# Patient Record
Sex: Male | Born: 1965
Health system: Southern US, Community
[De-identification: ages and names within clinical notes are randomized; demographics above are authoritative.]

## PROBLEM LIST (undated history)

## (undated) DIAGNOSIS — F39 Unspecified mood [affective] disorder: Secondary | ICD-10-CM

## (undated) DIAGNOSIS — F319 Bipolar disorder, unspecified: Secondary | ICD-10-CM

## (undated) DIAGNOSIS — F23 Brief psychotic disorder: Secondary | ICD-10-CM

## (undated) HISTORY — PX: FRACTURE SURGERY: SHX138

---

## 1999-05-21 ENCOUNTER — Emergency Department (HOSPITAL_COMMUNITY): Admission: EM | Admit: 1999-05-21 | Discharge: 1999-05-21 | Payer: Self-pay | Admitting: Emergency Medicine

## 1999-07-08 ENCOUNTER — Encounter: Payer: Self-pay | Admitting: Emergency Medicine

## 1999-07-08 ENCOUNTER — Emergency Department (HOSPITAL_COMMUNITY): Admission: EM | Admit: 1999-07-08 | Discharge: 1999-07-08 | Payer: Self-pay | Admitting: Emergency Medicine

## 1999-07-27 ENCOUNTER — Emergency Department (HOSPITAL_COMMUNITY): Admission: EM | Admit: 1999-07-27 | Discharge: 1999-07-27 | Payer: Self-pay | Admitting: Emergency Medicine

## 1999-09-10 ENCOUNTER — Encounter: Payer: Self-pay | Admitting: Emergency Medicine

## 1999-09-10 ENCOUNTER — Emergency Department (HOSPITAL_COMMUNITY): Admission: EM | Admit: 1999-09-10 | Discharge: 1999-09-10 | Payer: Self-pay | Admitting: Emergency Medicine

## 1999-09-11 ENCOUNTER — Encounter: Payer: Self-pay | Admitting: Emergency Medicine

## 1999-09-11 ENCOUNTER — Emergency Department (HOSPITAL_COMMUNITY): Admission: EM | Admit: 1999-09-11 | Discharge: 1999-09-11 | Payer: Self-pay | Admitting: Emergency Medicine

## 1999-09-23 ENCOUNTER — Emergency Department (HOSPITAL_COMMUNITY): Admission: EM | Admit: 1999-09-23 | Discharge: 1999-09-24 | Payer: Self-pay | Admitting: Emergency Medicine

## 2002-04-20 ENCOUNTER — Emergency Department (HOSPITAL_COMMUNITY): Admission: EM | Admit: 2002-04-20 | Discharge: 2002-04-20 | Payer: Self-pay | Admitting: Emergency Medicine

## 2002-09-30 ENCOUNTER — Emergency Department (HOSPITAL_COMMUNITY): Admission: EM | Admit: 2002-09-30 | Discharge: 2002-09-30 | Payer: Self-pay | Admitting: Emergency Medicine

## 2003-09-25 ENCOUNTER — Emergency Department (HOSPITAL_COMMUNITY): Admission: EM | Admit: 2003-09-25 | Discharge: 2003-09-25 | Payer: Self-pay | Admitting: *Deleted

## 2004-01-10 ENCOUNTER — Emergency Department (HOSPITAL_COMMUNITY): Admission: EM | Admit: 2004-01-10 | Discharge: 2004-01-10 | Payer: Self-pay | Admitting: Emergency Medicine

## 2004-01-22 ENCOUNTER — Ambulatory Visit (HOSPITAL_COMMUNITY): Admission: RE | Admit: 2004-01-22 | Discharge: 2004-01-22 | Payer: Self-pay | Admitting: Orthopedic Surgery

## 2004-02-05 ENCOUNTER — Ambulatory Visit: Payer: Self-pay | Admitting: Family Medicine

## 2004-04-09 ENCOUNTER — Ambulatory Visit: Payer: Self-pay | Admitting: Family Medicine

## 2004-10-31 ENCOUNTER — Emergency Department (HOSPITAL_COMMUNITY): Admission: EM | Admit: 2004-10-31 | Discharge: 2004-10-31 | Payer: Self-pay | Admitting: Emergency Medicine

## 2004-11-11 ENCOUNTER — Emergency Department (HOSPITAL_COMMUNITY): Admission: EM | Admit: 2004-11-11 | Discharge: 2004-11-11 | Payer: Self-pay | Admitting: Emergency Medicine

## 2004-11-17 ENCOUNTER — Emergency Department (HOSPITAL_COMMUNITY): Admission: EM | Admit: 2004-11-17 | Discharge: 2004-11-17 | Payer: Self-pay | Admitting: Emergency Medicine

## 2005-04-05 ENCOUNTER — Ambulatory Visit: Payer: Self-pay | Admitting: Psychiatry

## 2005-04-05 ENCOUNTER — Inpatient Hospital Stay (HOSPITAL_COMMUNITY): Admission: EM | Admit: 2005-04-05 | Discharge: 2005-04-10 | Payer: Self-pay | Admitting: Psychiatry

## 2017-06-23 ENCOUNTER — Other Ambulatory Visit: Payer: Self-pay

## 2017-06-23 ENCOUNTER — Emergency Department (HOSPITAL_COMMUNITY)
Admission: EM | Admit: 2017-06-23 | Discharge: 2017-06-25 | Disposition: A | Discharge status: Home / Self Care | Payer: Medicaid Other | Attending: Emergency Medicine | Admitting: Emergency Medicine

## 2017-06-23 ENCOUNTER — Encounter (HOSPITAL_COMMUNITY): Payer: Self-pay

## 2017-06-23 DIAGNOSIS — R45851 Suicidal ideations: Secondary | ICD-10-CM

## 2017-06-23 DIAGNOSIS — R443 Hallucinations, unspecified: Secondary | ICD-10-CM | POA: Diagnosis present

## 2017-06-23 DIAGNOSIS — R441 Visual hallucinations: Secondary | ICD-10-CM | POA: Diagnosis not present

## 2017-06-23 DIAGNOSIS — F17210 Nicotine dependence, cigarettes, uncomplicated: Secondary | ICD-10-CM | POA: Insufficient documentation

## 2017-06-23 DIAGNOSIS — F333 Major depressive disorder, recurrent, severe with psychotic symptoms: Secondary | ICD-10-CM | POA: Insufficient documentation

## 2017-06-23 DIAGNOSIS — R440 Auditory hallucinations: Secondary | ICD-10-CM

## 2017-06-23 LAB — CBC
HCT: 49 % (ref 39.0–52.0)
Hemoglobin: 16.8 g/dL (ref 13.0–17.0)
MCH: 29.5 pg (ref 26.0–34.0)
MCHC: 34.3 g/dL (ref 30.0–36.0)
MCV: 86 fL (ref 78.0–100.0)
PLATELETS: 187 10*3/uL (ref 150–400)
RBC: 5.7 MIL/uL (ref 4.22–5.81)
RDW: 14.5 % (ref 11.5–15.5)
WBC: 10 10*3/uL (ref 4.0–10.5)

## 2017-06-23 LAB — COMPREHENSIVE METABOLIC PANEL
ALT: 34 U/L (ref 17–63)
ANION GAP: 12 (ref 5–15)
AST: 41 U/L (ref 15–41)
Albumin: 4.1 g/dL (ref 3.5–5.0)
Alkaline Phosphatase: 85 U/L (ref 38–126)
BUN: 8 mg/dL (ref 6–20)
CO2: 22 mmol/L (ref 22–32)
Calcium: 8.9 mg/dL (ref 8.9–10.3)
Chloride: 101 mmol/L (ref 101–111)
Creatinine, Ser: 0.97 mg/dL (ref 0.61–1.24)
Glucose, Bld: 116 mg/dL — ABNORMAL HIGH (ref 65–99)
POTASSIUM: 3.6 mmol/L (ref 3.5–5.1)
Sodium: 135 mmol/L (ref 135–145)
Total Bilirubin: 0.5 mg/dL (ref 0.3–1.2)
Total Protein: 6.9 g/dL (ref 6.5–8.1)

## 2017-06-23 LAB — RAPID URINE DRUG SCREEN, HOSP PERFORMED
AMPHETAMINES: NOT DETECTED
BENZODIAZEPINES: NOT DETECTED
Barbiturates: NOT DETECTED
Cocaine: NOT DETECTED
OPIATES: NOT DETECTED
Tetrahydrocannabinol: NOT DETECTED

## 2017-06-23 LAB — ETHANOL

## 2017-06-23 LAB — ACETAMINOPHEN LEVEL

## 2017-06-23 LAB — SALICYLATE LEVEL

## 2017-06-23 MED ORDER — LORAZEPAM 1 MG PO TABS
1.0000 mg | ORAL_TABLET | Freq: Once | ORAL | Status: AC
Start: 2017-06-23 — End: 2017-06-23
  Administered 2017-06-23: 1 mg via ORAL
  Filled 2017-06-23: qty 1

## 2017-06-23 MED ORDER — IBUPROFEN 400 MG PO TABS
600.0000 mg | ORAL_TABLET | Freq: Three times a day (TID) | ORAL | Status: DC | PRN
  Administered 2017-06-23 – 2017-06-24 (×2): 600 mg via ORAL
  Filled 2017-06-23 (×4): qty 1

## 2017-06-23 MED ORDER — ALUM & MAG HYDROXIDE-SIMETH 200-200-20 MG/5ML PO SUSP: 30.0000 mL | Freq: Four times a day (QID) | ORAL | Status: DC | PRN

## 2017-06-23 MED ORDER — ZOLPIDEM TARTRATE 5 MG PO TABS
5.0000 mg | ORAL_TABLET | Freq: Every evening | ORAL | Status: DC | PRN
  Administered 2017-06-23 – 2017-06-24 (×2): 5 mg via ORAL
  Filled 2017-06-23 (×2): qty 1

## 2017-06-23 MED ORDER — NICOTINE 21 MG/24HR TD PT24
21.0000 mg | MEDICATED_PATCH | Freq: Every day | TRANSDERMAL | Status: DC
  Administered 2017-06-23 – 2017-06-24 (×2): 21 mg via TRANSDERMAL
  Filled 2017-06-23 (×3): qty 1

## 2017-06-23 MED ORDER — ONDANSETRON HCL 4 MG PO TABS: 4.0000 mg | ORAL_TABLET | Freq: Three times a day (TID) | ORAL | Status: DC | PRN

## 2017-06-23 NOTE — ED Notes (Signed)
PA informed that pt felt anxious, new orders given

## 2017-06-23 NOTE — ED Notes (Signed)
Pt given Malawiturkey sandwich, graham crackers, apple sauce and sprite for a snack

## 2017-06-23 NOTE — ED Provider Notes (Signed)
[facility] Provider Note   CSN: [id number] Arrival date & time: 06/23/17  1318     History   Chief Complaint Chief Complaint  Patient presents with  . Depression  . Hallucinations    HPI Francisco Anderson is a 52 y.o. male who presents the emergency department for hallucinations and suicidal thoughts.  His past medical history of schizophrenia and has states that he has been off of his medication for about 1 year.  The patient states that he feels like someone is out to try and kill him.  He is unable to specify who it is.  He is hearing voices and having visual hallucinations.  The voices are telling him to kill himself.  He states that "I just can't calm my mind down."  He admits to drinking about two 40 ounces of beer daily but denies any shakes when he does not drink and denies a history of DTs, seizures.  He does smoke daily.  He uses no other drugs.  He denies homicidal ideation. HPI  History reviewed. No pertinent past medical history.  There are no active problems to display for this patient.   History reviewed. No pertinent surgical history.      Home Medications    Prior to Admission medications   Not on File    Family History No family history on file.  Social History Social History   Tobacco Use  . Smoking status: Not on file  Substance Use Topics  . Alcohol use: Not on file  . Drug use: Not on file     Allergies   Patient has no known allergies.   Review of Systems Review of Systems  Ten systems reviewed and are negative for acute change, except as noted in the HPI.   Physical Exam Updated Vital Signs BP (!) 171/101 (BP Location: Right Arm)   Pulse (!) 101   Temp 98.8 F (37.1 C) (Oral)   Resp 17   Ht 5\' 7"  (1.702 m)   Wt 90.7 kg (200 lb)   SpO2 95%   BMI 31.32 kg/m   Physical Exam  Constitutional: He appears well-developed and well-nourished. No distress.  Smells strongly of cigarettes    HENT:  Head: Normocephalic and atraumatic.  Eyes: Conjunctivae are normal. No scleral icterus.  Neck: Normal range of motion. Neck supple.  Cardiovascular: Normal rate, regular rhythm and normal heart sounds.  Pulmonary/Chest: Effort normal and breath sounds normal. No respiratory distress.  Abdominal: Soft. There is no tenderness.  Musculoskeletal: He exhibits no edema.  Neurological: He is alert.  Skin: Skin is warm and dry. He is not diaphoretic.  Psychiatric: His behavior is normal. His mood appears anxious. Thought content is paranoid. He expresses suicidal ideation.  Nursing note and vitals reviewed.    ED Treatments / Results  Labs (all labs ordered are listed, but only abnormal results are displayed) Labs Reviewed  COMPREHENSIVE METABOLIC PANEL - Abnormal; Notable for the following components:      Result Value   Glucose, Bld 116 (*)    All other components within normal limits  ACETAMINOPHEN LEVEL - Abnormal; Notable for the following components:   Acetaminophen (Tylenol), Serum <10 (*)    All other components within normal limits  ETHANOL  SALICYLATE LEVEL  CBC  RAPID URINE DRUG SCREEN, HOSP PERFORMED    EKG None  Radiology No results found.  Procedures Procedures (including critical care time)  Medications Ordered in ED Medications  ibuprofen (  ADVIL,MOTRIN) tablet 600 mg (has no administration in time range)  zolpidem (AMBIEN) tablet 5 mg (has no administration in time range)  ondansetron (ZOFRAN) tablet 4 mg (has no administration in time range)  alum & mag hydroxide-simeth (MAALOX/MYLANTA) 200-200-20 MG/5ML suspension 30 mL (has no administration in time range)  nicotine (NICODERM CQ - dosed in mg/24 hours) patch 21 mg (has no administration in time range)  LORazepam (ATIVAN) tablet 1 mg (has no administration in time range)     Initial Impression / Assessment and Plan / ED Course  I have reviewed the triage vital signs and the nursing  notes.  Pertinent labs & imaging results that were available during my care of the patient were reviewed by me and considered in my medical decision making (see chart for details).     Labs resulted. Appears appropriate for psych eval  Final Clinical Impressions(s) / ED Diagnoses   Final diagnoses:  Suicidal ideation  Auditory hallucination  Visual hallucinations    ED Discharge Orders    None       Arthor Captain, PA-C 06/23/17 1517    Mesner, Barbara Cower, MD 06/23/17 1708

## 2017-06-23 NOTE — ED Triage Notes (Signed)
Pt presents for evaluation of SI and auditory hallucinations x 1 month. Pt reports hx of same. Pt tearful in triage, reports voices telling him to harm himself and others will harm him. Pt reports he drinks approximately 2 40 oz beers a day. Pt reports he feels he "cant rest his mind."

## 2017-06-23 NOTE — ED Notes (Signed)
TTS set up for pt 

## 2017-06-23 NOTE — BH Assessment (Addendum)
Tele Assessment Note   Patient Name: Francisco Anderson MRN: [id number] Referring Physician: Dr. Clayborne Dana Location of Patient: [facility] Location of Provider: Behavioral Health TTS Department  Francisco Anderson is a 52 y.o. male who presents to the ED due to auditory and visual hallucinations. Pt presents with labile affect, first very tearful and then irritable towards the end of the assessment. Pt reports that he's been hearing voices telling him to kill himself before someone kills him. He also said that the voices have told him to harm or kill others, but when asked to elaborate, pt said "I just don't wanna get hurt". Pt also indicates that he has started seeing "visions" of "everything". When asked for details of the visions, pt just continues to say "everything". Pt reports having the hallucinations since the beginning of the month. Pt reports not sleeping in 6 or 7 days due to the hallucinations. Pt additionally reports that he used some crack/cocaine at the beginning of the month, but none since then. Pt doesn't believe there is a link to the onset of his hallucinations and his relapse of drugs.  Pt reports that he had been taking medication for his hallucinations until last September, when he felt that he was stable enough to d/c them.     Diagnosis: MDD, recurrent episode, severe, w/ psychotic features  Past Medical History: History reviewed. No pertinent past medical history.  History reviewed. No pertinent surgical history.  Family History: No family history on file.  Social History:  has no tobacco, alcohol, and drug history on file.  Additional Social History:  Alcohol / Drug Use Pain Medications: none Prescriptions: none Over the Counter: none History of alcohol / drug use?: Yes(Pt not forthcoming with information about his drug use. ) Substance #1 Name of Substance 1: crack/cocaine 1 - Last Use / Amount: beginning of the month Substance #2 Name of Substance 2: alcohol (beer) 2 -  Amount (size/oz): 2 40oz beers 2 - Frequency: daily 2 - Duration: ongoing 2 - Last Use / Amount: yesterday  CIWA: CIWA-Ar BP: (!) 171/101 Pulse Rate: (!) 101 COWS:    Allergies: No Known Allergies  Home Medications:  (Not in a hospital admission)  OB/GYN Status:  No LMP for male patient.  General Assessment Data Location of Assessment: [facility] ED TTS Assessment: In system Is this a Tele or Face-to-Face Assessment?: Tele Assessment Is this an Initial Assessment or a Re-assessment for this encounter?: Initial Assessment Marital status: Single Living Arrangements: Other relatives Can pt return to current living arrangement?: Yes Admission Status: Voluntary Is patient capable of signing voluntary admission?: Yes Referral Source: Self/Family/Friend     Crisis Care Plan Living Arrangements: Other relatives Name of Psychiatrist: none Name of Therapist: none  Education Status Is patient currently in school?: No Is the patient employed, unemployed or receiving disability?: Receiving disability income(SSI)  Risk to self with the past 6 months Suicidal Ideation: Yes-Currently Present Has patient been a risk to self within the past 6 months prior to admission? : No Suicidal Intent: No Has patient had any suicidal intent within the past 6 months prior to admission? : No Is patient at risk for suicide?: Yes Suicidal Plan?: No Has patient had any suicidal plan within the past 6 months prior to admission? : No Access to Means: No Previous Attempts/Gestures: Yes How many times?: 3 Triggers for Past Attempts: Hallucinations Intentional Self Injurious Behavior: None Family Suicide History: Unknown Recent stressful life event(s): Other (Comment) Persecutory voices/beliefs?: No Depression: Yes  Depression Symptoms: Feeling angry/irritable, Feeling worthless/self pity, Loss of interest in usual pleasures, Fatigue, Isolating, Tearfulness, Insomnia, Despondent Substance abuse history  and/or treatment for substance abuse?: Yes Suicide prevention information given to non-admitted patients: Not applicable  Risk to Others within the past 6 months Homicidal Ideation: No Does patient have any lifetime risk of violence toward others beyond the six months prior to admission? : Unknown Thoughts of Harm to Others: Yes-Currently Present Comment - Thoughts of Harm to Others: Pt reports voices are telling him to harm others. Current Homicidal Intent: No Current Homicidal Plan: No Access to Homicidal Means: No History of harm to others?: No Assessment of Violence: None Noted Does patient have access to weapons?: No Criminal Charges Pending?: No Does patient have a court date: No Is patient on probation?: Unknown  Psychosis Hallucinations: Auditory, Visual Delusions: None noted  Mental Status Report Appearance/Hygiene: Unremarkable Eye Contact: Poor Motor Activity: Unremarkable Speech: Logical/coherent Level of Consciousness: Quiet/awake Mood: Irritable, Depressed Affect: Appropriate to circumstance Anxiety Level: Minimal Thought Processes: Coherent, Relevant Judgement: Impaired Orientation: Person, Place, Time, Situation Obsessive Compulsive Thoughts/Behaviors: None  Cognitive Functioning Concentration: Unable to Assess Memory: Unable to Assess Is patient IDD: No Is patient DD?: No Insight: Fair Impulse Control: Unable to Assess Appetite: Fair Have you had any weight changes? : No Change Sleep: Decreased Total Hours of Sleep: 0 Vegetative Symptoms: None  ADLScreening [facility] Assessment Services) Patient's cognitive ability adequate to safely complete daily activities?: Yes Patient able to express need for assistance with ADLs?: Yes Independently performs ADLs?: Yes (appropriate for developmental age)  Prior Inpatient Therapy Prior Inpatient Therapy: Yes Prior Therapy Dates: unknown Prior Therapy Facilty/Provider(s): unknown Reason for Treatment:  hallucinations  Prior Outpatient Therapy Prior Outpatient Therapy: Yes Prior Therapy Dates: unknown Prior Therapy Facilty/Provider(s): unknown Reason for Treatment: hallucinations Does patient have an ACCT team?: No Does patient have Intensive In-House Services?  : No Does patient have [facility] services? : No Does patient have P4CC services?: No  ADL Screening (condition at time of admission) Patient's cognitive ability adequate to safely complete daily activities?: Yes Does the patient have difficulty seeing, even when wearing glasses/contacts?: No Does the patient have difficulty concentrating, remembering, or making decisions?: Yes Patient able to express need for assistance with ADLs?: Yes Does the patient have difficulty dressing or bathing?: No Independently performs ADLs?: Yes (appropriate for developmental age) Does the patient have difficulty walking or climbing stairs?: No Weakness of Legs: None Weakness of Arms/Hands: None  Home Assistive Devices/Equipment Home Assistive Devices/Equipment: None    Abuse/Neglect Assessment (Assessment to be complete while patient is alone) Abuse/Neglect Assessment Can Be Completed: Yes Physical Abuse: Denies Verbal Abuse: Denies Sexual Abuse: Denies Exploitation of patient/patient's resources: Denies Self-Neglect: Denies Values / Beliefs Cultural Requests During Hospitalization: None Spiritual Requests During Hospitalization: None   Advance Directives (For Healthcare) Does Patient Have a Medical Advance Directive?: No Would patient like information on creating a medical advance directive?: No - Patient declined Nutrition Screen- [facility] Adult/WL/AP Patient's home diet: Regular Has the patient recently lost weight without trying?: No Has the patient been eating poorly because of a decreased appetite?: No Malnutrition Screening Tool Score: 0        Disposition: Pt is recommended for IP treatment.  Disposition Initial Assessment  Completed for this Encounter: Yes(consulted with Assunta FoundShuvon Rankin, NP)  This service was provided via telemedicine using a 2-way, interactive audio and Immunologistvideo technology.  Names of all persons participating in this telemedicine service and their role in this  encounter.   Laddie Aquas 06/23/2017 3:10 PM

## 2017-06-23 NOTE — ED Notes (Signed)
Dinner tray provided

## 2017-06-23 NOTE — Progress Notes (Signed)
Pt is appropriate for inpatient treatment per Assunta FoundShuvon Rankin, NP. CSW sent referral information to the following hospitals: [facility]  [facility]  [facility]  [facility]  [facility]  [facility]  CSW will continue to coordinate placement options.   Wells GuilesSarah Caylen Yardley, LCSW, LCAS Disposition CSW [facility]/TTS [phone]

## 2017-06-24 MED ORDER — SERTRALINE HCL 25 MG PO TABS
25.0000 mg | ORAL_TABLET | Freq: Every day | ORAL | Status: DC
  Administered 2017-06-24 – 2017-06-25 (×2): 25 mg via ORAL
  Filled 2017-06-24 (×3): qty 1

## 2017-06-24 MED ORDER — QUETIAPINE FUMARATE 25 MG PO TABS
25.0000 mg | ORAL_TABLET | Freq: Two times a day (BID) | ORAL | Status: DC
  Administered 2017-06-24 – 2017-06-25 (×3): 25 mg via ORAL
  Filled 2017-06-24 (×4): qty 1

## 2017-06-24 MED ORDER — HYDROXYZINE HCL 25 MG PO TABS
25.0000 mg | ORAL_TABLET | Freq: Three times a day (TID) | ORAL | Status: DC | PRN
  Administered 2017-06-24 (×2): 25 mg via ORAL
  Filled 2017-06-24 (×2): qty 1

## 2017-06-24 NOTE — ED Notes (Signed)
Regular Diet ordered for Dinner. 

## 2017-06-24 NOTE — Progress Notes (Signed)
Medication recommendation completed:  Plan: Start Seroquel 25 mg 1 tab po twice daily for psychosis Start vistaril 25 mg 1 tab po three times a day as needed for anxiety. Restart Zoloft 25 mg daily for depression Continue other current medications without change.  Signed: Ferne ReusJustina Sekou Zuckerman, NP-C

## 2017-06-24 NOTE — ED Notes (Signed)
Pt in shower.  

## 2017-06-24 NOTE — ED Notes (Signed)
A Snack and drink was placed at patient bedside. 

## 2017-06-24 NOTE — BH Assessment (Signed)
Pt is cooperative during reassessment. He reports he didn't sleep well until he got his night meds. He reports he ate a little bit of breakfast. Pt denies SI currently. He endorses auditory hallucinations.. He says the voices "sound like they are inside". He reports he has experienced AH since age 52. When asked if any meds have helped with his hallucinations, pt says that he used to get a weekly injection that helped him (doesn't remember name). Writer discussed patient with Leighton Ruffina Okonkwo NP who recommends inpatient treatment. TTS will continue to seek inpatient placement.  Evette Cristalaroline Paige Tylerjames Hoglund, [place] Therapeutic Triage Specialist

## 2017-06-24 NOTE — ED Notes (Signed)
Snack and drink provided. ?

## 2017-06-24 NOTE — ED Notes (Signed)
Re-TTS being performed.  

## 2017-06-24 NOTE — ED Notes (Signed)
Male visitor visiting w/pt. 

## 2017-06-24 NOTE — ED Notes (Signed)
Pt asking for Ativan states d/t "need to rest". States he is not experiencing AH/VH and denies SI/HI. Pt noted to be slow responding to questions.

## 2017-06-24 NOTE — ED Notes (Signed)
Pt requesting ibuprofen, informed him his last dose was 2150 and it is ordered every 8 hrs. Pt understanding and went back to room

## 2017-06-24 NOTE — ED Notes (Signed)
Pt made phone call from nurses' desk.  

## 2017-06-24 NOTE — ED Notes (Signed)
Ordered diet tray 

## 2017-06-24 NOTE — ED Notes (Signed)
Pt requesting ibuprofen again, told him he has to wait until 0550.

## 2017-06-24 NOTE — ED Notes (Signed)
Regular Diet was ordered for Lunch. 

## 2017-06-24 NOTE — ED Notes (Signed)
[facility] aware pt requesting meds to help w/AH - "You're not worth nothing". Orders received.

## 2017-06-25 ENCOUNTER — Other Ambulatory Visit: Payer: Self-pay

## 2017-06-25 DIAGNOSIS — F29 Unspecified psychosis not due to a substance or known physiological condition: Secondary | ICD-10-CM | POA: Diagnosis not present

## 2017-06-25 DIAGNOSIS — F419 Anxiety disorder, unspecified: Secondary | ICD-10-CM

## 2017-06-25 MED ORDER — QUETIAPINE FUMARATE 25 MG PO TABS: 25.0000 mg | ORAL_TABLET | Freq: Two times a day (BID) | ORAL | 0 refills | Status: DC

## 2017-06-25 MED ORDER — SERTRALINE HCL 25 MG PO TABS: 25.0000 mg | ORAL_TABLET | Freq: Every day | ORAL | 0 refills | Status: DC

## 2017-06-25 MED ORDER — HYDROXYZINE HCL 25 MG PO TABS: 25.0000 mg | ORAL_TABLET | Freq: Four times a day (QID) | ORAL | 0 refills | Status: AC | PRN

## 2017-06-25 NOTE — Progress Notes (Signed)
CSW notified [facility] that patient was going to be discharged.   Moss McKy-Sha Roosevelt Bisher MSW, LCSW-A, LCAS-A Clinical Social Worker 06/25/2017 11:12 AM

## 2017-06-25 NOTE — ED Notes (Signed)
Family visiting w/pt.  

## 2017-06-25 NOTE — ED Notes (Signed)
Pt given water as requested.

## 2017-06-25 NOTE — BH Assessment (Signed)
Patient cooperative during reassessment. He currently denies any auditory hallucinations. He says he slept "good" and his appetite is "great". Pt says, "I'm ready to go home." He denies suicidal ideations and denies homicidal ideations. Writer spoke with Leighton Ruffina Okonkwo NP who is in agreement that pt can go home. He is not a danger to himself or others. He [facility]. He says his family just visited him and he lives with the family. Writer notified pt's RN Kriste BasqueBecky who will speak w/ EDP re: disposition recommendation.  Evette Cristalaroline Paige Marshal Eskew, [place]CS[place] Therapeutic Triage Specialist

## 2017-06-25 NOTE — ED Notes (Signed)
Pt aware his family member is in lobby waiting to pick him up.

## 2017-06-25 NOTE — ED Notes (Signed)
Pt called his girlfriend/cousin to advise she will come pick him up but "it will be a little while". Pt returned to exam room and laid down on bed.

## 2017-06-25 NOTE — ED Notes (Addendum)
Visitor, Kelle DartingLiz Jacobs, who described herself as pt's cousin - noted to be sitting on bed w/pt. They are being observed by staff - kissing, hugging, and holding hands. Pt and visitor asking when pt can leave. Marisue IvanLiz states she feels comfortable for pt to come home as he lives w/her. Marisue IvanLiz provided her phone number for [facility] Counsel[facility] to call for collaborative info if needed - [phone]. 2nd male visitor left room - stating "I'm going to give them some time alone and stated "We are all cousins. I know it's weird". Advised them both visitation time has ended. Marisue IvanLiz attempted to stay longer w/pt.

## 2017-06-25 NOTE — Progress Notes (Addendum)
Patient meets criteria for inpatient treatment. [facility] CSW faxed referrals to the following facilities for review.  [place], [address], [facility], [place], [address] [place], [address] [place], [address], [address] [place], [place], [place], [facility], [place], [place], [facility], [facility], [place], and [facility] [place].   TTS will continue to seek bed placement.     Moss McKy-sha Wadie Liew, MSW, LCSW-A, LCAS-A 06/25/2017 8:53 AM

## 2017-06-25 NOTE — ED Notes (Signed)
Pt given graham crackers and sprite @ snack time.

## 2017-06-25 NOTE — ED Notes (Signed)
Pt lunch order placed. Lunch: BBQ Pulled Chicken Sandwich. JamaicaFrench Donzetta SprungFries. Tossed Salad with [employer] Dressing. Drink: Sweet Tea

## 2017-06-25 NOTE — ED Notes (Signed)
Breakfast tray ordered 

## 2017-06-25 NOTE — ED Notes (Signed)
States "I'm ready to talk to somebody to get out of here today". States meds have helped to decrease AH.

## 2017-06-25 NOTE — ED Notes (Signed)
Pt had advised this am he wanted to be d/c'd and he had asked for rx's for meds. Girlfriend, Marisue IvanLiz, voiced agreement. At discharge, pt became upset d/t stated he wanted actual pills. Advised pt staff are unable to dispense meds and recommended for him to follow up w/[facility] as instructed. Voiced understanding. Pt then attempted to call Marisue IvanLiz from phone at nurses' desk. RN advised him that she is in lobby waiting on him. Voiced understanding and was escorted to lobby by staff.

## 2017-06-25 NOTE — ED Notes (Signed)
Re-TTS being performed.  

## 2017-06-25 NOTE — ED Notes (Signed)
Pt up to restroom. No complaints.

## 2017-08-09 ENCOUNTER — Emergency Department (HOSPITAL_COMMUNITY): Payer: Medicaid Other

## 2017-08-09 ENCOUNTER — Emergency Department (HOSPITAL_COMMUNITY)
Admission: EM | Admit: 2017-08-09 | Discharge: 2017-08-10 | Disposition: A | Discharge status: Home / Self Care | Payer: Medicaid Other | Attending: Emergency Medicine | Admitting: Emergency Medicine

## 2017-08-09 ENCOUNTER — Other Ambulatory Visit: Payer: Self-pay

## 2017-08-09 DIAGNOSIS — Y9355 Activity, bike riding: Secondary | ICD-10-CM | POA: Diagnosis not present

## 2017-08-09 DIAGNOSIS — S3992XA Unspecified injury of lower back, initial encounter: Secondary | ICD-10-CM | POA: Diagnosis present

## 2017-08-09 DIAGNOSIS — R319 Hematuria, unspecified: Secondary | ICD-10-CM | POA: Diagnosis not present

## 2017-08-09 DIAGNOSIS — R109 Unspecified abdominal pain: Secondary | ICD-10-CM | POA: Insufficient documentation

## 2017-08-09 DIAGNOSIS — Z79899 Other long term (current) drug therapy: Secondary | ICD-10-CM | POA: Insufficient documentation

## 2017-08-09 DIAGNOSIS — Y92410 Unspecified street and highway as the place of occurrence of the external cause: Secondary | ICD-10-CM | POA: Insufficient documentation

## 2017-08-09 DIAGNOSIS — Y999 Unspecified external cause status: Secondary | ICD-10-CM | POA: Insufficient documentation

## 2017-08-09 DIAGNOSIS — S80812A Abrasion, left lower leg, initial encounter: Secondary | ICD-10-CM | POA: Insufficient documentation

## 2017-08-09 DIAGNOSIS — S39012A Strain of muscle, fascia and tendon of lower back, initial encounter: Secondary | ICD-10-CM | POA: Insufficient documentation

## 2017-08-09 DIAGNOSIS — S301XXA Contusion of abdominal wall, initial encounter: Secondary | ICD-10-CM | POA: Diagnosis not present

## 2017-08-09 MED ORDER — OXYCODONE-ACETAMINOPHEN 5-325 MG PO TABS: 1.0000 | ORAL_TABLET | ORAL | Status: DC | PRN

## 2017-08-09 MED ORDER — IBUPROFEN 400 MG PO TABS: 400.0000 mg | ORAL_TABLET | Freq: Once | ORAL | Status: DC | PRN

## 2017-08-09 NOTE — ED Notes (Signed)
Pt ambulated to lobby vending machines for Pepsi
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
## 2017-08-10 ENCOUNTER — Emergency Department (HOSPITAL_COMMUNITY): Payer: Medicaid Other

## 2017-08-10 LAB — COMPREHENSIVE METABOLIC PANEL
ALBUMIN: 3.6 g/dL (ref 3.5–5.0)
ALT: 32 U/L (ref 17–63)
AST: 32 U/L (ref 15–41)
Alkaline Phosphatase: 70 U/L (ref 38–126)
Anion gap: 9 (ref 5–15)
BUN: 13 mg/dL (ref 6–20)
CHLORIDE: 104 mmol/L (ref 101–111)
CO2: 26 mmol/L (ref 22–32)
CREATININE: 0.84 mg/dL (ref 0.61–1.24)
Calcium: 8.8 mg/dL — ABNORMAL LOW (ref 8.9–10.3)
Glucose, Bld: 88 mg/dL (ref 65–99)
Potassium: 3.3 mmol/L — ABNORMAL LOW (ref 3.5–5.1)
SODIUM: 139 mmol/L (ref 135–145)
Total Bilirubin: 0.6 mg/dL (ref 0.3–1.2)
Total Protein: 6.3 g/dL — ABNORMAL LOW (ref 6.5–8.1)

## 2017-08-10 LAB — URINALYSIS, ROUTINE W REFLEX MICROSCOPIC
BILIRUBIN URINE: NEGATIVE
Glucose, UA: NEGATIVE mg/dL
Hgb urine dipstick: NEGATIVE
KETONES UR: 5 mg/dL — AB
Nitrite: NEGATIVE
PH: 5 (ref 5.0–8.0)
PROTEIN: NEGATIVE mg/dL
Specific Gravity, Urine: 1.028 (ref 1.005–1.030)

## 2017-08-10 LAB — CBC WITH DIFFERENTIAL/PLATELET
Basophils Absolute: 0 10*3/uL (ref 0.0–0.1)
Basophils Relative: 1 %
Eosinophils Absolute: 0.2 10*3/uL (ref 0.0–0.7)
Eosinophils Relative: 3 %
HEMATOCRIT: 46.5 % (ref 39.0–52.0)
HEMOGLOBIN: 15.5 g/dL (ref 13.0–17.0)
LYMPHS ABS: 1.7 10*3/uL (ref 0.7–4.0)
LYMPHS PCT: 20 %
MCH: 28.4 pg (ref 26.0–34.0)
MCHC: 33.3 g/dL (ref 30.0–36.0)
MCV: 85.3 fL (ref 78.0–100.0)
MONOS PCT: 11 %
Monocytes Absolute: 0.9 10*3/uL (ref 0.1–1.0)
NEUTROS ABS: 5.3 10*3/uL (ref 1.7–7.7)
NEUTROS PCT: 65 %
Platelets: 172 10*3/uL (ref 150–400)
RBC: 5.45 MIL/uL (ref 4.22–5.81)
RDW: 13.9 % (ref 11.5–15.5)
WBC: 8.2 10*3/uL (ref 4.0–10.5)

## 2017-08-10 LAB — LIPASE, BLOOD: LIPASE: 83 U/L — AB (ref 11–51)

## 2017-08-10 MED ORDER — IOHEXOL 300 MG/ML  SOLN
100.0000 mL | Freq: Once | INTRAMUSCULAR | Status: AC | PRN
  Administered 2017-08-10: 100 mL via INTRAVENOUS

## 2017-08-10 MED ORDER — SODIUM CHLORIDE 0.9 % IV SOLN
INTRAVENOUS | Status: DC
  Administered 2017-08-10: 08:00:00 via INTRAVENOUS
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
## 2017-08-10 NOTE — ED Notes (Signed)
Patient didn't respond when reassessing vital signs.  Other patients stated that he had left

## 2017-08-10 NOTE — ED Notes (Signed)
Pt to return to lobby per EDPA to await higher acuity bed in the back.

## 2017-08-10 NOTE — ED Notes (Signed)
Patient aware that a urine sample is needed 
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
## 2017-08-11 LAB — URINE CULTURE

## 2017-08-12 ENCOUNTER — Other Ambulatory Visit: Payer: Self-pay

## 2017-08-12 ENCOUNTER — Emergency Department (HOSPITAL_COMMUNITY)
Admission: EM | Admit: 2017-08-12 | Discharge: 2017-08-12 | Disposition: A | Discharge status: Home / Self Care | Payer: Medicaid Other | Attending: Emergency Medicine | Admitting: Emergency Medicine

## 2017-08-12 ENCOUNTER — Encounter (HOSPITAL_COMMUNITY): Payer: Self-pay | Admitting: Emergency Medicine

## 2017-08-12 DIAGNOSIS — R319 Hematuria, unspecified: Secondary | ICD-10-CM | POA: Insufficient documentation

## 2017-08-12 DIAGNOSIS — Z5321 Procedure and treatment not carried out due to patient leaving prior to being seen by health care provider: Secondary | ICD-10-CM | POA: Diagnosis not present
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
## 2017-08-17 ENCOUNTER — Encounter (HOSPITAL_COMMUNITY): Payer: Self-pay

## 2017-08-17 ENCOUNTER — Emergency Department (HOSPITAL_COMMUNITY)
Admission: EM | Admit: 2017-08-17 | Discharge: 2017-08-17 | Disposition: A | Discharge status: Home / Self Care | Payer: Medicaid Other | Attending: Emergency Medicine | Admitting: Emergency Medicine

## 2017-08-17 ENCOUNTER — Other Ambulatory Visit: Payer: Self-pay

## 2017-08-17 DIAGNOSIS — Z79899 Other long term (current) drug therapy: Secondary | ICD-10-CM | POA: Insufficient documentation

## 2017-08-17 DIAGNOSIS — F17210 Nicotine dependence, cigarettes, uncomplicated: Secondary | ICD-10-CM | POA: Insufficient documentation

## 2017-08-17 DIAGNOSIS — M545 Low back pain, unspecified: Secondary | ICD-10-CM

## 2017-08-17 MED ORDER — METHOCARBAMOL 500 MG PO TABS: 500.0000 mg | ORAL_TABLET | Freq: Every evening | ORAL | 0 refills | Status: DC | PRN

## 2017-08-17 MED ORDER — NAPROXEN 500 MG PO TABS: 500.0000 mg | ORAL_TABLET | Freq: Two times a day (BID) | ORAL | 0 refills | Status: DC

## 2017-08-17 MED ORDER — MENTHOL (TOPICAL ANALGESIC) 7.5 % EX CREA: 1.0000 "application " | TOPICAL_CREAM | Freq: Three times a day (TID) | CUTANEOUS | 0 refills | Status: DC | PRN
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
## 2017-08-18 ENCOUNTER — Emergency Department (HOSPITAL_COMMUNITY): Payer: Medicaid Other

## 2017-08-18 ENCOUNTER — Emergency Department (HOSPITAL_COMMUNITY)
Admission: EM | Admit: 2017-08-18 | Discharge: 2017-08-18 | Disposition: A | Discharge status: Home / Self Care | Payer: Medicaid Other | Attending: Emergency Medicine | Admitting: Emergency Medicine

## 2017-08-18 ENCOUNTER — Encounter (HOSPITAL_COMMUNITY): Payer: Self-pay | Admitting: Emergency Medicine

## 2017-08-18 DIAGNOSIS — M545 Low back pain: Secondary | ICD-10-CM | POA: Insufficient documentation

## 2017-08-18 DIAGNOSIS — Z79899 Other long term (current) drug therapy: Secondary | ICD-10-CM | POA: Insufficient documentation

## 2017-08-18 DIAGNOSIS — F17210 Nicotine dependence, cigarettes, uncomplicated: Secondary | ICD-10-CM | POA: Insufficient documentation

## 2017-08-18 DIAGNOSIS — M5410 Radiculopathy, site unspecified: Secondary | ICD-10-CM

## 2017-08-18 MED ORDER — PREDNISONE 20 MG PO TABS: ORAL_TABLET | ORAL | 0 refills | Status: DC
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
## 2017-08-18 NOTE — ED Notes (Signed)
ED Provider at bedside. 

## 2017-08-18 NOTE — ED Notes (Signed)
Patient transported to X-ray 
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
## 2017-08-19 ENCOUNTER — Other Ambulatory Visit: Payer: Self-pay

## 2017-08-19 ENCOUNTER — Emergency Department (HOSPITAL_COMMUNITY)
Admission: EM | Admit: 2017-08-19 | Discharge: 2017-08-19 | Disposition: A | Discharge status: Home / Self Care | Payer: Medicaid Other | Attending: Emergency Medicine | Admitting: Emergency Medicine

## 2017-08-19 ENCOUNTER — Encounter (HOSPITAL_COMMUNITY): Payer: Self-pay | Admitting: Emergency Medicine

## 2017-08-19 DIAGNOSIS — Y929 Unspecified place or not applicable: Secondary | ICD-10-CM | POA: Diagnosis not present

## 2017-08-19 DIAGNOSIS — Z79899 Other long term (current) drug therapy: Secondary | ICD-10-CM | POA: Insufficient documentation

## 2017-08-19 DIAGNOSIS — Y9355 Activity, bike riding: Secondary | ICD-10-CM | POA: Diagnosis not present

## 2017-08-19 DIAGNOSIS — S80811A Abrasion, right lower leg, initial encounter: Secondary | ICD-10-CM | POA: Insufficient documentation

## 2017-08-19 DIAGNOSIS — Y999 Unspecified external cause status: Secondary | ICD-10-CM | POA: Diagnosis not present

## 2017-08-19 DIAGNOSIS — F17210 Nicotine dependence, cigarettes, uncomplicated: Secondary | ICD-10-CM | POA: Insufficient documentation

## 2017-08-19 DIAGNOSIS — S8991XA Unspecified injury of right lower leg, initial encounter: Secondary | ICD-10-CM | POA: Diagnosis present
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
## 2017-08-19 NOTE — ED Notes (Signed)
Patient able to ambulate independently  
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
## 2017-08-21 ENCOUNTER — Emergency Department (HOSPITAL_COMMUNITY): Payer: Medicaid Other

## 2017-08-21 ENCOUNTER — Encounter (HOSPITAL_COMMUNITY): Payer: Self-pay | Admitting: Nurse Practitioner

## 2017-08-21 ENCOUNTER — Other Ambulatory Visit: Payer: Self-pay

## 2017-08-21 ENCOUNTER — Observation Stay (HOSPITAL_COMMUNITY)
Admission: EM | Admit: 2017-08-21 | Discharge: 2017-08-22 | Disposition: A | Discharge status: Home / Self Care | Payer: Medicaid Other | Attending: Internal Medicine | Admitting: Internal Medicine

## 2017-08-21 DIAGNOSIS — S41001A Unspecified open wound of right shoulder, initial encounter: Secondary | ICD-10-CM | POA: Insufficient documentation

## 2017-08-21 DIAGNOSIS — S01311A Laceration without foreign body of right ear, initial encounter: Secondary | ICD-10-CM | POA: Diagnosis not present

## 2017-08-21 DIAGNOSIS — M546 Pain in thoracic spine: Secondary | ICD-10-CM | POA: Diagnosis not present

## 2017-08-21 DIAGNOSIS — F17200 Nicotine dependence, unspecified, uncomplicated: Secondary | ICD-10-CM | POA: Insufficient documentation

## 2017-08-21 DIAGNOSIS — R55 Syncope and collapse: Secondary | ICD-10-CM | POA: Diagnosis not present

## 2017-08-21 DIAGNOSIS — R220 Localized swelling, mass and lump, head: Secondary | ICD-10-CM | POA: Diagnosis not present

## 2017-08-21 DIAGNOSIS — Z79899 Other long term (current) drug therapy: Secondary | ICD-10-CM | POA: Diagnosis not present

## 2017-08-21 DIAGNOSIS — S41101A Unspecified open wound of right upper arm, initial encounter: Secondary | ICD-10-CM | POA: Diagnosis not present

## 2017-08-21 DIAGNOSIS — F39 Unspecified mood [affective] disorder: Secondary | ICD-10-CM | POA: Diagnosis not present

## 2017-08-21 DIAGNOSIS — I272 Pulmonary hypertension, unspecified: Secondary | ICD-10-CM | POA: Insufficient documentation

## 2017-08-21 DIAGNOSIS — Y998 Other external cause status: Secondary | ICD-10-CM | POA: Diagnosis not present

## 2017-08-21 DIAGNOSIS — S1091XA Abrasion of unspecified part of neck, initial encounter: Secondary | ICD-10-CM | POA: Diagnosis not present

## 2017-08-21 DIAGNOSIS — Y9355 Activity, bike riding: Secondary | ICD-10-CM | POA: Insufficient documentation

## 2017-08-21 DIAGNOSIS — M5021 Other cervical disc displacement,  high cervical region: Secondary | ICD-10-CM | POA: Diagnosis not present

## 2017-08-21 DIAGNOSIS — S21109A Unspecified open wound of unspecified front wall of thorax without penetration into thoracic cavity, initial encounter: Secondary | ICD-10-CM | POA: Insufficient documentation

## 2017-08-21 DIAGNOSIS — S0181XA Laceration without foreign body of other part of head, initial encounter: Secondary | ICD-10-CM | POA: Diagnosis not present

## 2017-08-21 DIAGNOSIS — S0990XA Unspecified injury of head, initial encounter: Secondary | ICD-10-CM

## 2017-08-21 HISTORY — DX: Unspecified mood (affective) disorder: F39

## 2017-08-21 LAB — URINALYSIS, ROUTINE W REFLEX MICROSCOPIC
Bilirubin Urine: NEGATIVE
Glucose, UA: NEGATIVE mg/dL
Hgb urine dipstick: NEGATIVE
KETONES UR: 5 mg/dL — AB
LEUKOCYTES UA: NEGATIVE
NITRITE: NEGATIVE
PROTEIN: NEGATIVE mg/dL
Specific Gravity, Urine: 1.019 (ref 1.005–1.030)
pH: 6 (ref 5.0–8.0)

## 2017-08-21 LAB — RAPID URINE DRUG SCREEN, HOSP PERFORMED
AMPHETAMINES: NOT DETECTED
BENZODIAZEPINES: NOT DETECTED
Barbiturates: NOT DETECTED
Cocaine: NOT DETECTED
OPIATES: NOT DETECTED
Tetrahydrocannabinol: NOT DETECTED

## 2017-08-21 LAB — COMPREHENSIVE METABOLIC PANEL
ALT: 22 U/L (ref 17–63)
ANION GAP: 9 (ref 5–15)
AST: 26 U/L (ref 15–41)
Albumin: 4.1 g/dL (ref 3.5–5.0)
Alkaline Phosphatase: 80 U/L (ref 38–126)
BUN: 21 mg/dL — ABNORMAL HIGH (ref 6–20)
CHLORIDE: 103 mmol/L (ref 101–111)
CO2: 26 mmol/L (ref 22–32)
Calcium: 9.1 mg/dL (ref 8.9–10.3)
Creatinine, Ser: 0.88 mg/dL (ref 0.61–1.24)
Glucose, Bld: 136 mg/dL — ABNORMAL HIGH (ref 65–99)
POTASSIUM: 3.8 mmol/L (ref 3.5–5.1)
Sodium: 138 mmol/L (ref 135–145)
TOTAL PROTEIN: 7.4 g/dL (ref 6.5–8.1)
Total Bilirubin: 0.3 mg/dL (ref 0.3–1.2)

## 2017-08-21 LAB — CBC WITH DIFFERENTIAL/PLATELET
BASOS ABS: 0 10*3/uL (ref 0.0–0.1)
Basophils Relative: 0 %
EOS PCT: 1 %
Eosinophils Absolute: 0.1 10*3/uL (ref 0.0–0.7)
HCT: 48.4 % (ref 39.0–52.0)
Hemoglobin: 16.2 g/dL (ref 13.0–17.0)
LYMPHS PCT: 8 %
Lymphs Abs: 0.9 10*3/uL (ref 0.7–4.0)
MCH: 28.9 pg (ref 26.0–34.0)
MCHC: 33.5 g/dL (ref 30.0–36.0)
MCV: 86.4 fL (ref 78.0–100.0)
MONO ABS: 1 10*3/uL (ref 0.1–1.0)
MONOS PCT: 9 %
Neutro Abs: 9.3 10*3/uL — ABNORMAL HIGH (ref 1.7–7.7)
Neutrophils Relative %: 82 %
PLATELETS: 168 10*3/uL (ref 150–400)
RBC: 5.6 MIL/uL (ref 4.22–5.81)
RDW: 14 % (ref 11.5–15.5)
WBC: 11.3 10*3/uL — ABNORMAL HIGH (ref 4.0–10.5)

## 2017-08-21 LAB — ETHANOL

## 2017-08-21 LAB — TROPONIN I: Troponin I: <0.03 ng/mL (ref <0.03)

## 2017-08-21 MED ORDER — POLYETHYLENE GLYCOL 3350 17 G PO PACK: 17.0000 g | PACK | Freq: Every day | ORAL | Status: DC | PRN

## 2017-08-21 MED ORDER — SODIUM CHLORIDE 0.9 % IV SOLN
INTRAVENOUS | Status: DC
  Administered 2017-08-21 – 2017-08-22 (×2): via INTRAVENOUS

## 2017-08-21 MED ORDER — ACETAMINOPHEN 325 MG PO TABS: 650.0000 mg | ORAL_TABLET | Freq: Four times a day (QID) | ORAL | Status: DC | PRN

## 2017-08-21 MED ORDER — BUPIVACAINE HCL 0.5 % IJ SOLN
10.0000 mL | Freq: Once | INTRAMUSCULAR | Status: AC
  Administered 2017-08-21: 10 mL
  Filled 2017-08-21: qty 10

## 2017-08-21 MED ORDER — ACETAMINOPHEN 650 MG RE SUPP: 650.0000 mg | Freq: Four times a day (QID) | RECTAL | Status: DC | PRN

## 2017-08-21 MED ORDER — TRAMADOL HCL 50 MG PO TABS: 50.0000 mg | ORAL_TABLET | Freq: Four times a day (QID) | ORAL | Status: DC | PRN

## 2017-08-21 MED ORDER — QUETIAPINE FUMARATE 25 MG PO TABS: 25.0000 mg | ORAL_TABLET | Freq: Every day | ORAL | Status: DC | PRN

## 2017-08-21 MED ORDER — TRAZODONE HCL 50 MG PO TABS
50.0000 mg | ORAL_TABLET | Freq: Every day | ORAL | Status: DC
  Administered 2017-08-21: 50 mg via ORAL
  Filled 2017-08-21: qty 1

## 2017-08-21 MED ORDER — CEPHALEXIN 500 MG PO CAPS
500.0000 mg | ORAL_CAPSULE | Freq: Three times a day (TID) | ORAL | Status: DC
  Administered 2017-08-22 (×2): 500 mg via ORAL
  Filled 2017-08-21 (×2): qty 1

## 2017-08-21 MED ORDER — SERTRALINE HCL 25 MG PO TABS
25.0000 mg | ORAL_TABLET | Freq: Every day | ORAL | Status: DC
  Administered 2017-08-22: 25 mg via ORAL
  Filled 2017-08-21: qty 1

## 2017-08-21 MED ORDER — QUETIAPINE FUMARATE 50 MG PO TABS
150.0000 mg | ORAL_TABLET | Freq: Every day | ORAL | Status: DC
  Administered 2017-08-21: 150 mg via ORAL
  Filled 2017-08-21 (×2): qty 3

## 2017-08-21 MED ORDER — LIDOCAINE HCL 2 % IJ SOLN
10.0000 mL | Freq: Once | INTRAMUSCULAR | Status: AC
  Administered 2017-08-21: 200 mg via INTRADERMAL
  Filled 2017-08-21: qty 20

## 2017-08-21 MED ORDER — FENTANYL CITRATE (PF) 100 MCG/2ML IJ SOLN
50.0000 ug | Freq: Once | INTRAMUSCULAR | Status: AC
  Administered 2017-08-21: 50 ug via INTRAVENOUS
  Filled 2017-08-21: qty 2

## 2017-08-21 MED ORDER — ONDANSETRON HCL 4 MG/2ML IJ SOLN: 4.0000 mg | Freq: Four times a day (QID) | INTRAMUSCULAR | Status: DC | PRN

## 2017-08-21 MED ORDER — DIVALPROEX SODIUM 250 MG PO DR TAB
250.0000 mg | DELAYED_RELEASE_TABLET | Freq: Every day | ORAL | Status: DC
  Administered 2017-08-21: 250 mg via ORAL
  Filled 2017-08-21: qty 1

## 2017-08-21 MED ORDER — ENOXAPARIN SODIUM 40 MG/0.4ML ~~LOC~~ SOLN
40.0000 mg | SUBCUTANEOUS | Status: DC
  Administered 2017-08-21: 40 mg via SUBCUTANEOUS
  Filled 2017-08-21: qty 0.4

## 2017-08-21 MED ORDER — CEFAZOLIN SODIUM-DEXTROSE 1-4 GM/50ML-% IV SOLN
1.0000 g | Freq: Once | INTRAVENOUS | Status: AC
  Administered 2017-08-21: 1 g via INTRAVENOUS
  Filled 2017-08-21: qty 50

## 2017-08-21 MED ORDER — ONDANSETRON HCL 4 MG PO TABS: 4.0000 mg | ORAL_TABLET | Freq: Four times a day (QID) | ORAL | Status: DC | PRN
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
## 2017-08-21 NOTE — ED Notes (Signed)
Attempted to call report
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
## 2017-08-22 ENCOUNTER — Other Ambulatory Visit: Payer: Self-pay

## 2017-08-22 ENCOUNTER — Observation Stay (HOSPITAL_BASED_OUTPATIENT_CLINIC_OR_DEPARTMENT_OTHER): Payer: Medicaid Other

## 2017-08-22 DIAGNOSIS — R55 Syncope and collapse: Secondary | ICD-10-CM | POA: Diagnosis not present

## 2017-08-22 DIAGNOSIS — I361 Nonrheumatic tricuspid (valve) insufficiency: Secondary | ICD-10-CM

## 2017-08-22 DIAGNOSIS — F39 Unspecified mood [affective] disorder: Secondary | ICD-10-CM

## 2017-08-22 DIAGNOSIS — S0990XA Unspecified injury of head, initial encounter: Secondary | ICD-10-CM | POA: Diagnosis not present

## 2017-08-22 LAB — ECHOCARDIOGRAM COMPLETE
HEIGHTINCHES: 67 in
WEIGHTICAEL: 3056 [oz_av]

## 2017-08-22 LAB — CBC
HCT: 46.2 % (ref 39.0–52.0)
Hemoglobin: 14.9 g/dL (ref 13.0–17.0)
MCH: 28.2 pg (ref 26.0–34.0)
MCHC: 32.3 g/dL (ref 30.0–36.0)
MCV: 87.3 fL (ref 78.0–100.0)
Platelets: 152 K/uL (ref 150–400)
RBC: 5.29 MIL/uL (ref 4.22–5.81)
RDW: 14.4 % (ref 11.5–15.5)
WBC: 6.6 K/uL (ref 4.0–10.5)

## 2017-08-22 LAB — BASIC METABOLIC PANEL
Anion gap: 8 (ref 5–15)
BUN: 14 mg/dL (ref 6–20)
CO2: 25 mmol/L (ref 22–32)
Calcium: 8.8 mg/dL — ABNORMAL LOW (ref 8.9–10.3)
Glucose, Bld: 102 mg/dL — ABNORMAL HIGH (ref 65–99)
Potassium: 3.5 mmol/L (ref 3.5–5.1)

## 2017-08-22 LAB — BASIC METABOLIC PANEL WITH GFR
Chloride: 108 mmol/L (ref 101–111)
Creatinine, Ser: 0.79 mg/dL (ref 0.61–1.24)
GFR calc Af Amer: >60 mL/min (ref >60)
GFR calc non Af Amer: >60 mL/min (ref >60)
Sodium: 141 mmol/L (ref 135–145)

## 2017-08-22 LAB — HIV ANTIBODY (ROUTINE TESTING W REFLEX): HIV Screen 4th Generation wRfx: NONREACTIVE

## 2017-08-22 MED ORDER — NICOTINE 21 MG/24HR TD PT24
21.0000 mg | MEDICATED_PATCH | Freq: Every day | TRANSDERMAL | Status: DC
  Administered 2017-08-22: 21 mg via TRANSDERMAL
  Filled 2017-08-22: qty 1

## 2017-08-22 MED ORDER — TRAMADOL HCL 50 MG PO TABS: 50.0000 mg | ORAL_TABLET | Freq: Three times a day (TID) | ORAL | 0 refills | Status: AC | PRN

## 2017-08-22 MED ORDER — QUETIAPINE FUMARATE 50 MG PO TABS: 150.0000 mg | ORAL_TABLET | Freq: Every day | ORAL | Status: DC

## 2017-08-22 MED ORDER — CEPHALEXIN 500 MG PO CAPS: 500.0000 mg | ORAL_CAPSULE | Freq: Three times a day (TID) | ORAL | 0 refills | Status: AC

## 2017-08-22 MED ORDER — NICOTINE 21 MG/24HR TD PT24: 21.0000 mg | MEDICATED_PATCH | Freq: Every day | TRANSDERMAL | 0 refills | Status: DC

## 2017-08-22 NOTE — Progress Notes (Signed)
Patient discharge teaching given, including activity, diet, follow-up appoints, and medications. Patient verbalized understanding of all discharge instructions. IV access was d/c'd. Vitals are stable. Skin is intact except as charted in most recent assessments. Pt to be escorted out by NT, to be driven home by family.  Waiting on ride to pick patient up.

## 2017-08-22 NOTE — Plan of Care (Signed)
  Problem: Safety: Goal: Ability to remain free from injury will improve Outcome: Progressing   

## 2017-08-22 NOTE — Progress Notes (Signed)
  Echocardiogram 2D Echocardiogram has been performed.  Dorena Dew Sharea Guinther 08/22/2017, 4:48 PM
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
## 2017-08-23 ENCOUNTER — Encounter (HOSPITAL_COMMUNITY): Payer: Self-pay

## 2017-08-23 ENCOUNTER — Emergency Department (HOSPITAL_COMMUNITY)
Admission: EM | Admit: 2017-08-23 | Discharge: 2017-08-24 | Disposition: A | Discharge status: Home / Self Care | Payer: Medicaid Other | Attending: Emergency Medicine | Admitting: Emergency Medicine

## 2017-08-23 DIAGNOSIS — Y999 Unspecified external cause status: Secondary | ICD-10-CM | POA: Insufficient documentation

## 2017-08-23 DIAGNOSIS — F17200 Nicotine dependence, unspecified, uncomplicated: Secondary | ICD-10-CM | POA: Diagnosis not present

## 2017-08-23 DIAGNOSIS — Z79899 Other long term (current) drug therapy: Secondary | ICD-10-CM | POA: Diagnosis not present

## 2017-08-23 DIAGNOSIS — E876 Hypokalemia: Secondary | ICD-10-CM

## 2017-08-23 DIAGNOSIS — R42 Dizziness and giddiness: Secondary | ICD-10-CM | POA: Diagnosis not present

## 2017-08-23 DIAGNOSIS — S00411D Abrasion of right ear, subsequent encounter: Secondary | ICD-10-CM | POA: Insufficient documentation

## 2017-08-23 DIAGNOSIS — Y9355 Activity, bike riding: Secondary | ICD-10-CM | POA: Insufficient documentation

## 2017-08-23 DIAGNOSIS — S0990XD Unspecified injury of head, subsequent encounter: Secondary | ICD-10-CM | POA: Diagnosis present

## 2017-08-23 DIAGNOSIS — S0081XD Abrasion of other part of head, subsequent encounter: Secondary | ICD-10-CM

## 2017-08-23 DIAGNOSIS — Y929 Unspecified place or not applicable: Secondary | ICD-10-CM | POA: Insufficient documentation

## 2017-08-23 LAB — CBC
HCT: 47.4 % (ref 39.0–52.0)
HEMOGLOBIN: 15.2 g/dL (ref 13.0–17.0)
MCH: 27 pg (ref 26.0–34.0)
MCHC: 32.1 g/dL (ref 30.0–36.0)
MCV: 84 fL (ref 78.0–100.0)
Platelets: 185 10*3/uL (ref 150–400)
RBC: 5.64 MIL/uL (ref 4.22–5.81)
RDW: 13.6 % (ref 11.5–15.5)
WBC: 7.2 10*3/uL (ref 4.0–10.5)

## 2017-08-23 LAB — BASIC METABOLIC PANEL
ANION GAP: 10 (ref 5–15)
BUN: 8 mg/dL (ref 6–20)
CALCIUM: 9 mg/dL (ref 8.9–10.3)
CO2: 25 mmol/L (ref 22–32)
Chloride: 103 mmol/L (ref 101–111)
Creatinine, Ser: 0.74 mg/dL (ref 0.61–1.24)
Glucose, Bld: 133 mg/dL — ABNORMAL HIGH (ref 65–99)
Potassium: 3.1 mmol/L — ABNORMAL LOW (ref 3.5–5.1)
SODIUM: 138 mmol/L (ref 135–145)

## 2017-08-23 LAB — URINALYSIS, ROUTINE W REFLEX MICROSCOPIC
Bilirubin Urine: NEGATIVE
Glucose, UA: NEGATIVE mg/dL
Hgb urine dipstick: NEGATIVE
Ketones, ur: NEGATIVE mg/dL
LEUKOCYTES UA: NEGATIVE
NITRITE: NEGATIVE
PROTEIN: NEGATIVE mg/dL
SPECIFIC GRAVITY, URINE: 1.011 (ref 1.005–1.030)
pH: 6 (ref 5.0–8.0)
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
## 2017-08-24 ENCOUNTER — Emergency Department (HOSPITAL_COMMUNITY): Payer: Medicaid Other

## 2017-08-24 MED ORDER — POTASSIUM CHLORIDE CRYS ER 20 MEQ PO TBCR
40.0000 meq | EXTENDED_RELEASE_TABLET | Freq: Once | ORAL | Status: AC
  Administered 2017-08-24: 40 meq via ORAL
  Filled 2017-08-24: qty 2

## 2017-08-24 MED ORDER — POTASSIUM CHLORIDE CRYS ER 20 MEQ PO TBCR: 40.0000 meq | EXTENDED_RELEASE_TABLET | Freq: Once | ORAL | Status: DC
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
## 2017-08-24 NOTE — ED Notes (Signed)
Called for vitals no response

## 2017-08-24 NOTE — ED Notes (Signed)
Pt called for vitals; no response.  

## 2017-08-24 NOTE — ED Notes (Signed)
Pt up to BR
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
## 2017-08-25 ENCOUNTER — Emergency Department (HOSPITAL_COMMUNITY)
Admission: EM | Admit: 2017-08-25 | Discharge: 2017-08-25 | Disposition: A | Discharge status: Home / Self Care | Payer: Medicaid Other | Attending: Emergency Medicine | Admitting: Emergency Medicine

## 2017-08-25 ENCOUNTER — Other Ambulatory Visit: Payer: Self-pay

## 2017-08-25 ENCOUNTER — Emergency Department (HOSPITAL_COMMUNITY)
Admission: EM | Admit: 2017-08-25 | Discharge: 2017-08-25 | Disposition: A | Discharge status: Home / Self Care | Payer: Medicaid Other | Source: Home / Self Care | Attending: Emergency Medicine | Admitting: Emergency Medicine

## 2017-08-25 ENCOUNTER — Encounter (HOSPITAL_COMMUNITY): Payer: Self-pay | Admitting: Emergency Medicine

## 2017-08-25 ENCOUNTER — Encounter (HOSPITAL_COMMUNITY): Payer: Self-pay

## 2017-08-25 DIAGNOSIS — G510 Bell's palsy: Secondary | ICD-10-CM | POA: Insufficient documentation

## 2017-08-25 DIAGNOSIS — R42 Dizziness and giddiness: Secondary | ICD-10-CM

## 2017-08-25 DIAGNOSIS — Z79899 Other long term (current) drug therapy: Secondary | ICD-10-CM | POA: Diagnosis not present

## 2017-08-25 DIAGNOSIS — F17210 Nicotine dependence, cigarettes, uncomplicated: Secondary | ICD-10-CM | POA: Diagnosis not present

## 2017-08-25 DIAGNOSIS — H81391 Other peripheral vertigo, right ear: Secondary | ICD-10-CM

## 2017-08-25 LAB — CBC WITH DIFFERENTIAL/PLATELET
ABS IMMATURE GRANULOCYTES: 0 10*3/uL (ref 0.0–0.1)
BASOS ABS: 0.1 10*3/uL (ref 0.0–0.1)
Basophils Relative: 1 %
Eosinophils Absolute: 0.2 10*3/uL (ref 0.0–0.7)
Eosinophils Relative: 2 %
HCT: 50.1 % (ref 39.0–52.0)
HEMOGLOBIN: 16.3 g/dL (ref 13.0–17.0)
IMMATURE GRANULOCYTES: 0 %
LYMPHS PCT: 17 %
Lymphs Abs: 1.4 10*3/uL (ref 0.7–4.0)
MCH: 27.3 pg (ref 26.0–34.0)
MCHC: 32.5 g/dL (ref 30.0–36.0)
MCV: 83.9 fL (ref 78.0–100.0)
MONO ABS: 0.8 10*3/uL (ref 0.1–1.0)
Monocytes Relative: 10 %
NEUTROS ABS: 5.6 10*3/uL (ref 1.7–7.7)
NEUTROS PCT: 70 %
Platelets: 226 10*3/uL (ref 150–400)
RBC: 5.97 MIL/uL — AB (ref 4.22–5.81)
RDW: 13.5 % (ref 11.5–15.5)
WBC: 7.9 10*3/uL (ref 4.0–10.5)

## 2017-08-25 LAB — BASIC METABOLIC PANEL
ANION GAP: 9 (ref 5–15)
BUN: 15 mg/dL (ref 6–20)
CO2: 25 mmol/L (ref 22–32)
Calcium: 9.7 mg/dL (ref 8.9–10.3)
Chloride: 104 mmol/L (ref 101–111)
Creatinine, Ser: 0.83 mg/dL (ref 0.61–1.24)
GFR calc Af Amer: >60 mL/min (ref >60)
GLUCOSE: 104 mg/dL — AB (ref 65–99)
POTASSIUM: 4.1 mmol/L (ref 3.5–5.1)
Sodium: 138 mmol/L (ref 135–145)

## 2017-08-25 MED ORDER — PREDNISONE 20 MG PO TABS
60.0000 mg | ORAL_TABLET | Freq: Once | ORAL | Status: AC
  Administered 2017-08-25: 60 mg via ORAL
  Filled 2017-08-25: qty 3

## 2017-08-25 MED ORDER — PREDNISONE 10 MG PO TABS: 60.0000 mg | ORAL_TABLET | Freq: Every day | ORAL | 0 refills | Status: DC

## 2017-08-25 MED ORDER — ACYCLOVIR 400 MG PO TABS: 400.0000 mg | ORAL_TABLET | Freq: Every day | ORAL | 0 refills | Status: AC

## 2017-08-25 MED ORDER — ARTIFICIAL TEARS OPHTHALMIC OINT
1.0000 "application " | TOPICAL_OINTMENT | Freq: Once | OPHTHALMIC | Status: AC
  Administered 2017-08-25: 1 via OPHTHALMIC
  Filled 2017-08-25 (×2): qty 3.5

## 2017-08-25 MED ORDER — MECLIZINE HCL 25 MG PO TABS: 25.0000 mg | ORAL_TABLET | Freq: Three times a day (TID) | ORAL | 0 refills | Status: DC | PRN

## 2017-08-25 NOTE — ED Notes (Signed)
Pt approached the desk stating he need to leave and get to his house. Pt was encouraged to stay and was updated on the wait time. Pt was seen walking out.

## 2017-08-25 NOTE — Discharge Summary (Signed)
Dr. Nanavati at bedside 
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
## 2017-08-25 NOTE — Discharge Instructions (Addendum)
Please see the Neurologist as requested.
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
## 2017-08-27 ENCOUNTER — Emergency Department (HOSPITAL_COMMUNITY)
Admission: EM | Admit: 2017-08-27 | Discharge: 2017-08-27 | Disposition: A | Discharge status: Home / Self Care | Payer: Medicaid Other | Attending: Physician Assistant | Admitting: Physician Assistant

## 2017-08-27 ENCOUNTER — Encounter (HOSPITAL_COMMUNITY): Payer: Self-pay

## 2017-08-27 DIAGNOSIS — W228XXD Striking against or struck by other objects, subsequent encounter: Secondary | ICD-10-CM | POA: Insufficient documentation

## 2017-08-27 DIAGNOSIS — S01311D Laceration without foreign body of right ear, subsequent encounter: Secondary | ICD-10-CM | POA: Diagnosis present

## 2017-08-27 DIAGNOSIS — Z5189 Encounter for other specified aftercare: Secondary | ICD-10-CM | POA: Diagnosis not present

## 2017-08-27 DIAGNOSIS — F17200 Nicotine dependence, unspecified, uncomplicated: Secondary | ICD-10-CM | POA: Insufficient documentation

## 2017-08-27 MED ORDER — CEPHALEXIN 250 MG PO CAPS
500.0000 mg | ORAL_CAPSULE | Freq: Once | ORAL | Status: AC
  Administered 2017-08-27: 500 mg via ORAL
  Filled 2017-08-27: qty 2
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
## 2017-08-27 NOTE — Discharge Instructions (Signed)
Apply antibiotic ointment to the area as directed.  Please take the antibiotics that were prescribed to you at your previous visit.
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
## 2017-08-28 ENCOUNTER — Emergency Department (HOSPITAL_COMMUNITY)
Admission: EM | Admit: 2017-08-28 | Discharge: 2017-08-28 | Disposition: A | Discharge status: Home / Self Care | Payer: Medicaid Other | Attending: Emergency Medicine | Admitting: Emergency Medicine

## 2017-08-28 ENCOUNTER — Encounter (HOSPITAL_COMMUNITY): Payer: Self-pay | Admitting: Emergency Medicine

## 2017-08-28 DIAGNOSIS — Z5321 Procedure and treatment not carried out due to patient leaving prior to being seen by health care provider: Secondary | ICD-10-CM | POA: Diagnosis not present

## 2017-08-28 DIAGNOSIS — R51 Headache: Secondary | ICD-10-CM | POA: Insufficient documentation

## 2017-08-28 NOTE — ED Triage Notes (Signed)
Pt c/o pain behind right ear onset last evening @ 1900. Pt states he was here earlier to have wound re wrapped and thinks HA is due to not having wound covered and being in [facility]. Pt reports he has not filled meds for Bells palsy yet.

## 2017-08-28 NOTE — ED Notes (Signed)
Patient to NF desk stating that his HA feels much better after putting gauze over his ear wound. Patient states that he is checking out now since he is now pain free. RN notified.

## 2017-08-29 ENCOUNTER — Encounter (HOSPITAL_COMMUNITY): Payer: Self-pay | Admitting: Emergency Medicine

## 2017-08-29 ENCOUNTER — Emergency Department (HOSPITAL_COMMUNITY)
Admission: EM | Admit: 2017-08-29 | Discharge: 2017-08-30 | Disposition: A | Discharge status: Home / Self Care | Payer: Medicaid Other | Attending: Emergency Medicine | Admitting: Emergency Medicine

## 2017-08-29 DIAGNOSIS — Z4801 Encounter for change or removal of surgical wound dressing: Secondary | ICD-10-CM | POA: Diagnosis present

## 2017-08-29 DIAGNOSIS — Z5321 Procedure and treatment not carried out due to patient leaving prior to being seen by health care provider: Secondary | ICD-10-CM | POA: Diagnosis not present
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
## 2017-08-30 ENCOUNTER — Other Ambulatory Visit: Payer: Self-pay

## 2017-08-30 ENCOUNTER — Emergency Department (HOSPITAL_COMMUNITY)
Admission: EM | Admit: 2017-08-30 | Discharge: 2017-08-30 | Disposition: A | Discharge status: Home / Self Care | Payer: Medicaid Other | Attending: Emergency Medicine | Admitting: Emergency Medicine

## 2017-08-30 ENCOUNTER — Encounter (HOSPITAL_COMMUNITY): Payer: Self-pay | Admitting: Emergency Medicine

## 2017-08-30 DIAGNOSIS — F17200 Nicotine dependence, unspecified, uncomplicated: Secondary | ICD-10-CM | POA: Insufficient documentation

## 2017-08-30 DIAGNOSIS — Z5189 Encounter for other specified aftercare: Secondary | ICD-10-CM | POA: Insufficient documentation

## 2017-08-30 DIAGNOSIS — S01311D Laceration without foreign body of right ear, subsequent encounter: Secondary | ICD-10-CM | POA: Diagnosis not present

## 2017-08-30 DIAGNOSIS — Z79899 Other long term (current) drug therapy: Secondary | ICD-10-CM | POA: Insufficient documentation

## 2017-08-30 NOTE — ED Notes (Signed)
Pt came to desk asking if he had been called, he had fallen asleep.  RN "undischarged pt in the computer system."

## 2017-08-30 NOTE — ED Notes (Signed)
No reply for vitals x3. 
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
## 2017-08-30 NOTE — ED Notes (Signed)
Pt is leaving. Will follow up tomorrow. Encouraged to stay. Returns labels and wristband removed.

## 2017-08-30 NOTE — ED Triage Notes (Signed)
No answer when called for room 

## 2017-08-30 NOTE — Discharge Instructions (Signed)
Please read attached information. If you experience any new or worsening signs or symptoms please return to the emergency room for evaluation. Please follow-up with your primary care provider or specialist as discussed. Please use medication prescribed only as directed and discontinue taking if you have any concerning signs or symptoms.   °
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
## 2017-08-30 NOTE — ED Notes (Signed)
Patient verbalized understanding of discharge instructions and denies any further needs or questions at this time. VS stable. Patient ambulatory with steady gait.  

## 2017-10-03 ENCOUNTER — Emergency Department (HOSPITAL_COMMUNITY)
Admission: EM | Admit: 2017-10-03 | Discharge: 2017-10-03 | Disposition: A | Discharge status: Home / Self Care | Payer: Medicaid Other | Attending: Emergency Medicine | Admitting: Emergency Medicine

## 2017-10-03 ENCOUNTER — Other Ambulatory Visit: Payer: Self-pay

## 2017-10-03 DIAGNOSIS — Y999 Unspecified external cause status: Secondary | ICD-10-CM | POA: Insufficient documentation

## 2017-10-03 DIAGNOSIS — F17200 Nicotine dependence, unspecified, uncomplicated: Secondary | ICD-10-CM | POA: Insufficient documentation

## 2017-10-03 DIAGNOSIS — S0501XA Injury of conjunctiva and corneal abrasion without foreign body, right eye, initial encounter: Secondary | ICD-10-CM | POA: Diagnosis not present

## 2017-10-03 DIAGNOSIS — Y939 Activity, unspecified: Secondary | ICD-10-CM | POA: Insufficient documentation

## 2017-10-03 DIAGNOSIS — Z79899 Other long term (current) drug therapy: Secondary | ICD-10-CM | POA: Insufficient documentation

## 2017-10-03 DIAGNOSIS — Z590 Homelessness: Secondary | ICD-10-CM | POA: Diagnosis not present

## 2017-10-03 DIAGNOSIS — Y33XXXA Other specified events, undetermined intent, initial encounter: Secondary | ICD-10-CM | POA: Insufficient documentation

## 2017-10-03 DIAGNOSIS — R21 Rash and other nonspecific skin eruption: Secondary | ICD-10-CM | POA: Diagnosis not present

## 2017-10-03 DIAGNOSIS — G510 Bell's palsy: Secondary | ICD-10-CM | POA: Diagnosis not present

## 2017-10-03 DIAGNOSIS — Y929 Unspecified place or not applicable: Secondary | ICD-10-CM | POA: Diagnosis not present

## 2017-10-03 DIAGNOSIS — H5711 Ocular pain, right eye: Secondary | ICD-10-CM | POA: Diagnosis present

## 2017-10-03 MED ORDER — TETRACAINE HCL 0.5 % OP SOLN
2.0000 [drp] | Freq: Once | OPHTHALMIC | Status: AC
  Administered 2017-10-03: 2 [drp] via OPHTHALMIC
  Filled 2017-10-03: qty 4

## 2017-10-03 MED ORDER — PERMETHRIN 5 % EX CREA: TOPICAL_CREAM | CUTANEOUS | 0 refills | Status: DC

## 2017-10-03 MED ORDER — FLUORESCEIN SODIUM 1 MG OP STRP
1.0000 | ORAL_STRIP | Freq: Once | OPHTHALMIC | Status: AC
  Administered 2017-10-03: 1 via OPHTHALMIC
  Filled 2017-10-03: qty 1

## 2017-10-03 MED ORDER — POLYMYXIN B-TRIMETHOPRIM 10000-0.1 UNIT/ML-% OP SOLN: 1.0000 [drp] | Freq: Four times a day (QID) | OPHTHALMIC | 0 refills | Status: AC
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
## 2017-10-03 NOTE — Care Management (Signed)
ED CM consulted concerning medication assistance. Patient is homeless and needs assistance with obtaining antibiotic. CM noted patient to have Francisco Anderson, CM spoke with patient who reports not having the $3 co-pay. CM called [facility] on [address] who has agreed to waive co-pay. CM made patient aware he [facility] ED [facility] [address] [facility]. Updated Triage PA-C . No further ED CM needs identified.

## 2017-10-03 NOTE — ED Notes (Signed)
ED Provider at bedside. 

## 2017-10-03 NOTE — ED Provider Notes (Signed)
Patient placed in Quick Look pathway, seen and evaluated   Chief Complaint: right eye redness, rash  HPI:   Francisco Anderson is a 52 y.o. male who presents to the ED for a rash and right eye redness. Patient reports feeling like something in his right eye for the past few days. Patient reports the more he rubs his eye the worse it gets. The rash is on patient's back and into the folds of the buttock. Patient reports he is homeless.   ROS: skin: rash  Physical Exam:  BP (!) 143/85 (BP Location: Right Arm)   Pulse 98   Temp 97.7 F (36.5 C) (Oral)   Resp 18   Ht 5\' 7"  (1.702 m)   Wt 83 kg (183 lb)   SpO2 95%   BMI 28.66 kg/m    Gen: No distress  Neuro: Awake and Alert  Skin: rash to back and abdomen that is spreading  Eyes: right sclera with erythema     Initiation of care has begun. The patient has been counseled on the process, plan, and necessity for staying for the completion/evaluation, and the remainder of the medical screening examination    Janne Napoleoneese, Kazoua Gossen M, NP 10/03/17 [id number]    Gerhard MunchLockwood, Robert, MD 10/03/17 2354

## 2017-10-03 NOTE — ED Triage Notes (Signed)
Patient c/o right eye drainage and burning. Secondary to this patient c/o rash on his back.
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
## 2017-11-03 ENCOUNTER — Encounter (HOSPITAL_COMMUNITY): Payer: Self-pay

## 2017-11-03 ENCOUNTER — Emergency Department (HOSPITAL_COMMUNITY)
Admission: EM | Admit: 2017-11-03 | Discharge: 2017-11-04 | Disposition: A | Discharge status: Home / Self Care | Payer: No Typology Code available for payment source | Attending: Emergency Medicine | Admitting: Emergency Medicine

## 2017-11-03 ENCOUNTER — Other Ambulatory Visit: Payer: Self-pay

## 2017-11-03 DIAGNOSIS — Z79899 Other long term (current) drug therapy: Secondary | ICD-10-CM | POA: Insufficient documentation

## 2017-11-03 DIAGNOSIS — R45851 Suicidal ideations: Secondary | ICD-10-CM | POA: Insufficient documentation

## 2017-11-03 DIAGNOSIS — F203 Undifferentiated schizophrenia: Secondary | ICD-10-CM | POA: Insufficient documentation

## 2017-11-03 LAB — COMPREHENSIVE METABOLIC PANEL
ALBUMIN: 4 g/dL (ref 3.5–5.0)
ALT: 32 U/L (ref 0–44)
ANION GAP: 12 (ref 5–15)
AST: 35 U/L (ref 15–41)
Alkaline Phosphatase: 90 U/L (ref 38–126)
BILIRUBIN TOTAL: 0.8 mg/dL (ref 0.3–1.2)
BUN: 12 mg/dL (ref 6–20)
CO2: 27 mmol/L (ref 22–32)
Calcium: 9.2 mg/dL (ref 8.9–10.3)
Chloride: 101 mmol/L (ref 98–111)
Creatinine, Ser: 0.96 mg/dL (ref 0.61–1.24)
GFR calc non Af Amer: >60 mL/min (ref >60)
GLUCOSE: 125 mg/dL — AB (ref 70–99)
POTASSIUM: 3.7 mmol/L (ref 3.5–5.1)
Sodium: 140 mmol/L (ref 135–145)
TOTAL PROTEIN: 7 g/dL (ref 6.5–8.1)

## 2017-11-03 LAB — ETHANOL

## 2017-11-03 LAB — CBC
HEMATOCRIT: 52.8 % — AB (ref 39.0–52.0)
Hemoglobin: 16.9 g/dL (ref 13.0–17.0)
MCH: 27.5 pg (ref 26.0–34.0)
MCHC: 32 g/dL (ref 30.0–36.0)
MCV: 85.9 fL (ref 78.0–100.0)
PLATELETS: 172 10*3/uL (ref 150–400)
RBC: 6.15 MIL/uL — ABNORMAL HIGH (ref 4.22–5.81)
RDW: 17.8 % — AB (ref 11.5–15.5)
WBC: 5.8 10*3/uL (ref 4.0–10.5)

## 2017-11-03 LAB — ACETAMINOPHEN LEVEL

## 2017-11-03 LAB — SALICYLATE LEVEL

## 2017-11-03 MED ORDER — DIVALPROEX SODIUM 250 MG PO DR TAB
250.0000 mg | DELAYED_RELEASE_TABLET | Freq: Every day | ORAL | Status: DC
  Administered 2017-11-03: 250 mg via ORAL
  Filled 2017-11-03: qty 1

## 2017-11-03 MED ORDER — SERTRALINE HCL 25 MG PO TABS
25.0000 mg | ORAL_TABLET | Freq: Every day | ORAL | Status: DC
  Administered 2017-11-03: 25 mg via ORAL
  Filled 2017-11-03: qty 1

## 2017-11-03 MED ORDER — QUETIAPINE FUMARATE 50 MG PO TABS
150.0000 mg | ORAL_TABLET | Freq: Every day | ORAL | Status: DC
  Administered 2017-11-03: 150 mg via ORAL
  Filled 2017-11-03: qty 3

## 2017-11-03 MED ORDER — NICOTINE 21 MG/24HR TD PT24
21.0000 mg | MEDICATED_PATCH | Freq: Every day | TRANSDERMAL | Status: DC
  Administered 2017-11-03: 21 mg via TRANSDERMAL
  Filled 2017-11-03: qty 1

## 2017-11-03 NOTE — ED Notes (Signed)
Meal tray ordered 
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
## 2017-11-03 NOTE — ED Notes (Addendum)
Per Travis, BH PA, the pt has been cleared by psychiatry and the pt is to be discharged at 0700, same was discussed with the ED provider. Pt will be going to the [facility]. There are 2 bus passes on clipboard for pt; 1 [place] bus pass to get the pt to the [facility] terminal and then 1 [facility] bus pass to get the pt to the [facility] terminal in [place].  

## 2017-11-03 NOTE — ED Notes (Signed)
Pt resting at this time.

## 2017-11-03 NOTE — ED Triage Notes (Signed)
Pt last seen 10-03-17 and needed help to pay for his medication.

## 2017-11-03 NOTE — ED Notes (Addendum)
BH counselor at bedside 
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
## 2017-11-03 NOTE — ED Provider Notes (Signed)
[facility]   CSN: [id number] Arrival date & time: 11/03/17  1008     History   Chief Complaint Chief Complaint  Patient presents with  . Suicidal    HPI Francisco Anderson is a 52 y.o. male.  52 year old male presents the emergency room with complaint of suicidal ideation with plan to cut himself, reports visual hallucinations.  Patient reports history of similar in the past reports previous suicide attempt by hanging himself.  Patient states that he has been out of his medications for the past 3 weeks.  He denies any other complaints or concerns.  Denies drug or alcohol use, homicidal ideations.     Past Medical History:  Diagnosis Date  . Mood disorder (HCC) 08/21/2017    Patient Active Problem List   Diagnosis Date Noted  . Syncope 08/21/2017  . Mood disorder (HCC) 08/21/2017    Past Surgical History:  Procedure Laterality Date  . FRACTURE SURGERY          Home Medications    Prior to Admission medications   Medication Sig Start Date End Date Taking? Authorizing Provider  divalproex (DEPAKOTE) 250 MG DR tablet Take 250 mg by mouth at bedtime.    [provider]  meclizine (ANTIVERT) 25 MG tablet Take 1 tablet (25 mg total) by mouth 3 (three) times daily as needed for dizziness. 08/25/17   Derwood KaplanNanavati, Ankit, MD  Menthol, Topical Analgesic, (ICY HOT NATURALS) 7.5 % CREA Apply 1 application topically 3 (three) times daily as needed. Patient taking differently: Apply 1 application topically 3 (three) times daily as needed (pain).  08/17/17   Georgiana ShoreMitchell, Jessica B, PA-C  methocarbamol (ROBAXIN) 500 MG tablet Take 1 tablet (500 mg total) by mouth at bedtime as needed. Patient taking differently: Take 500 mg by mouth at bedtime as needed for muscle spasms.  08/17/17   Mathews RobinsonsMitchell, Jessica B, PA-C  naproxen (NAPROSYN) 500 MG tablet Take 1 tablet (500 mg total) by mouth 2 (two) times daily with a meal. Patient taking  differently: Take 500 mg by mouth 2 (two) times daily as needed for mild pain.  08/17/17   Georgiana ShoreMitchell, Jessica B, PA-C  nicotine (NICODERM CQ - DOSED IN MG/24 HOURS) 21 mg/24hr patch Place 1 patch (21 mg total) onto the skin daily. 08/23/17   Briant CedarEzenduka, Nkeiruka J, MD  permethrin (ELIMITE) 5 % cream Apply cream to affected area once a day. 10/03/17   Claude MangesSoto, Johana, PA-C  predniSONE (DELTASONE) 10 MG tablet Take 6 tablets (60 mg total) by mouth daily. 08/25/17   Derwood KaplanNanavati, Ankit, MD  QUEtiapine (SEROQUEL) 100 MG tablet Take 150 mg by mouth at bedtime. 08/30/17   [provider]  QUEtiapine (SEROQUEL) 50 MG tablet Take 3 tablets (150 mg total) by mouth at bedtime. Patient not taking: Reported on 11/03/2017 08/22/17   Briant CedarEzenduka, Nkeiruka J, MD  sertraline (ZOLOFT) 25 MG tablet Take 25 mg by mouth daily. 08/30/17   [provider]    Family History No family history on file.  Social History Social History   Tobacco Use  . Smoking status: Current Every Day Smoker    Packs/day: 2.00  . Smokeless tobacco: Never Used  Substance Use Topics  . Alcohol use: Yes    Comment: Last use 11-02-17 2 40 oz beer   . Drug use: Yes    Types: Cocaine    Comment: yesterday crack cocaine 11-02-17     Allergies   Patient has no known  allergies.   Review of Systems Review of Systems  Constitutional: Negative for fever.  Cardiovascular: Negative for chest pain.  Gastrointestinal: Negative for abdominal pain.  Genitourinary: Negative for difficulty urinating.  Musculoskeletal: Negative for arthralgias and myalgias.  Skin: Negative for rash.  Allergic/Immunologic: Negative for immunocompromised state.  Neurological: Negative for weakness.  Psychiatric/Behavioral: Positive for hallucinations and suicidal ideas. Negative for agitation and confusion.  All other systems reviewed and are negative.    Physical Exam Updated Vital Signs BP 104/77 (BP Location: Right Arm)   Pulse 83   Temp 98 F (36.7 C)  (Oral)   Resp 17   Ht 5\' 7"  (1.702 m)   Wt 84.8 kg (187 lb)   SpO2 95%   BMI 29.29 kg/m   Physical Exam  Constitutional: He is oriented to person, place, and time. He appears well-developed and well-nourished. No distress.  HENT:  Head: Normocephalic and atraumatic.  Cardiovascular: Normal rate, regular rhythm, normal heart sounds and intact distal pulses.  No murmur heard. Pulmonary/Chest: Effort normal and breath sounds normal. No respiratory distress.  Neurological: He is alert and oriented to person, place, and time.  Skin: Skin is warm and dry. He is not diaphoretic.  Psychiatric: His speech is normal and behavior is normal. Cognition and memory are normal. He expresses suicidal ideation. He expresses no homicidal ideation.  Flat affect  Nursing note and vitals reviewed.    ED Treatments / Results  Labs (all labs ordered are listed, but only abnormal results are displayed) Labs Reviewed  COMPREHENSIVE METABOLIC PANEL - Abnormal; Notable for the following components:      Result Value   Glucose, Bld 125 (*)    All other components within normal limits  ACETAMINOPHEN LEVEL - Abnormal; Notable for the following components:   Acetaminophen (Tylenol), Serum <10 (*)    All other components within normal limits  CBC - Abnormal; Notable for the following components:   RBC 6.15 (*)    HCT 52.8 (*)    RDW 17.8 (*)    All other components within normal limits  ETHANOL  SALICYLATE LEVEL  RAPID URINE DRUG SCREEN, HOSP PERFORMED    EKG None  Radiology No results found.  Procedures Procedures (including critical care time)  Medications Ordered in ED Medications  divalproex (DEPAKOTE) DR tablet 250 mg (has no administration in time range)  nicotine (NICODERM CQ - dosed in mg/24 hours) patch 21 mg (has no administration in time range)  QUEtiapine (SEROQUEL) tablet 150 mg (has no administration in time range)  sertraline (ZOLOFT) tablet 25 mg (has no administration in  time range)     Initial Impression / Assessment and Plan / ED Course  I have reviewed the triage vital signs and the nursing notes.  Pertinent labs & imaging results that were available during my care of the patient were reviewed by me and considered in my medical decision making (see chart for details).  Clinical Course as of Nov 03 1648  Fri Nov 03, 2017  [id number] 52 year old male with history of mood disorder presents with complaint of suicidal ideation with plan to cut himself, visual hallucinations.  Reports history of similar in the past however has been out of his medications for the past 3 weeks.  No other complaints or concerns.  Patient is medically cleared for behavioral health evaluation.   [LM]    Clinical Course User Index [LM] Jeannie Fend, PA-C    Final Clinical Impressions(s) / ED Diagnoses   Final  diagnoses:  Suicidal ideation    ED Discharge Orders    None       Jeannie Fend, PA-C 11/03/17 1650    Melene Plan, DO 11/03/17 1935

## 2017-11-03 NOTE — ED Triage Notes (Signed)
Pt states that he has been having thoughts of hurting himself since being out of his medication for 2 weeks. Pt reports that he takes Seroquel, Depakote, and another medication "to keep his mind from racing". Pt reports he thinks someone took his medication, also c/o hearing voices.

## 2017-11-04 NOTE — ED Notes (Addendum)
Pt ate breakfast. Pt voiced understanding of d/c paperwork [facility] pass and [facility] bus pass given w/instructions.. ALL Belongings - 2 labeled belongings bags and 1 Valuables Envelope returned to pt - Pt signed verifying all items present. Pt did not sign e-signature d/t pad not working. Pt denies SI/HI.

## 2018-01-22 ENCOUNTER — Other Ambulatory Visit: Payer: Self-pay

## 2018-01-22 ENCOUNTER — Encounter (HOSPITAL_COMMUNITY): Payer: Self-pay | Admitting: Emergency Medicine

## 2018-01-22 ENCOUNTER — Emergency Department (HOSPITAL_COMMUNITY)
Admission: EM | Admit: 2018-01-22 | Discharge: 2018-01-23 | Disposition: A | Discharge status: Home / Self Care | Payer: No Typology Code available for payment source | Attending: Emergency Medicine | Admitting: Emergency Medicine

## 2018-01-22 DIAGNOSIS — F10120 Alcohol abuse with intoxication, uncomplicated: Secondary | ICD-10-CM | POA: Diagnosis not present

## 2018-01-22 DIAGNOSIS — Z79899 Other long term (current) drug therapy: Secondary | ICD-10-CM | POA: Diagnosis not present

## 2018-01-22 DIAGNOSIS — F10920 Alcohol use, unspecified with intoxication, uncomplicated: Secondary | ICD-10-CM

## 2018-01-22 DIAGNOSIS — F332 Major depressive disorder, recurrent severe without psychotic features: Secondary | ICD-10-CM | POA: Diagnosis not present

## 2018-01-22 DIAGNOSIS — F329 Major depressive disorder, single episode, unspecified: Secondary | ICD-10-CM | POA: Diagnosis present

## 2018-01-22 DIAGNOSIS — F39 Unspecified mood [affective] disorder: Secondary | ICD-10-CM | POA: Insufficient documentation

## 2018-01-22 DIAGNOSIS — F17200 Nicotine dependence, unspecified, uncomplicated: Secondary | ICD-10-CM | POA: Insufficient documentation

## 2018-01-22 DIAGNOSIS — R45851 Suicidal ideations: Secondary | ICD-10-CM | POA: Insufficient documentation

## 2018-01-22 LAB — COMPREHENSIVE METABOLIC PANEL
ALT: 28 U/L (ref 0–44)
ANION GAP: 9 (ref 5–15)
AST: 27 U/L (ref 15–41)
Albumin: 4.1 g/dL (ref 3.5–5.0)
Alkaline Phosphatase: 69 U/L (ref 38–126)
BUN: 10 mg/dL (ref 6–20)
CHLORIDE: 106 mmol/L (ref 98–111)
CO2: 26 mmol/L (ref 22–32)
Calcium: 8.7 mg/dL — ABNORMAL LOW (ref 8.9–10.3)
Creatinine, Ser: 0.8 mg/dL (ref 0.61–1.24)
GFR calc non Af Amer: >60 mL/min (ref >60)
Glucose, Bld: 114 mg/dL — ABNORMAL HIGH (ref 70–99)
Potassium: 3.6 mmol/L (ref 3.5–5.1)
SODIUM: 141 mmol/L (ref 135–145)
Total Bilirubin: 0.9 mg/dL (ref 0.3–1.2)
Total Protein: 7.4 g/dL (ref 6.5–8.1)

## 2018-01-22 LAB — ETHANOL: Alcohol, Ethyl (B): 127 mg/dL — ABNORMAL HIGH (ref <10)

## 2018-01-22 LAB — CBC
HCT: 52 % (ref 39.0–52.0)
Hemoglobin: 16.7 g/dL (ref 13.0–17.0)
MCH: 27.3 pg (ref 26.0–34.0)
MCHC: 32.1 g/dL (ref 30.0–36.0)
MCV: 85 fL (ref 80.0–100.0)
PLATELETS: 187 10*3/uL (ref 150–400)
RBC: 6.12 MIL/uL — AB (ref 4.22–5.81)
RDW: 14.1 % (ref 11.5–15.5)
WBC: 6.8 10*3/uL (ref 4.0–10.5)
nRBC: 0 % (ref 0.0–0.2)

## 2018-01-22 LAB — RAPID URINE DRUG SCREEN, HOSP PERFORMED
AMPHETAMINES: NOT DETECTED
BENZODIAZEPINES: NOT DETECTED
Barbiturates: NOT DETECTED
Cocaine: NOT DETECTED
OPIATES: NOT DETECTED
Tetrahydrocannabinol: NOT DETECTED

## 2018-01-22 LAB — SALICYLATE LEVEL

## 2018-01-22 LAB — ACETAMINOPHEN LEVEL: Acetaminophen (Tylenol), Serum: <10 ug/mL — ABNORMAL LOW (ref 10–30)

## 2018-01-22 MED ORDER — ONDANSETRON HCL 4 MG PO TABS: 4.0000 mg | ORAL_TABLET | Freq: Three times a day (TID) | ORAL | Status: DC | PRN

## 2018-01-22 MED ORDER — VITAMIN B-1 100 MG PO TABS
100.0000 mg | ORAL_TABLET | Freq: Every day | ORAL | Status: DC
  Administered 2018-01-23: 100 mg via ORAL
  Filled 2018-01-22: qty 1

## 2018-01-22 MED ORDER — LORAZEPAM 1 MG PO TABS: 0.0000 mg | ORAL_TABLET | Freq: Two times a day (BID) | ORAL | Status: DC

## 2018-01-22 MED ORDER — LORAZEPAM 1 MG PO TABS: 0.0000 mg | ORAL_TABLET | Freq: Four times a day (QID) | ORAL | Status: DC

## 2018-01-22 MED ORDER — NICOTINE 21 MG/24HR TD PT24: 21.0000 mg | MEDICATED_PATCH | Freq: Every day | TRANSDERMAL | Status: DC

## 2018-01-22 MED ORDER — LORAZEPAM 2 MG/ML IJ SOLN
0.0000 mg | Freq: Four times a day (QID) | INTRAMUSCULAR | Status: DC
  Administered 2018-01-22: 1 mg via INTRAVENOUS
  Filled 2018-01-22: qty 1

## 2018-01-22 MED ORDER — LORAZEPAM 2 MG/ML IJ SOLN: 0.0000 mg | Freq: Two times a day (BID) | INTRAMUSCULAR | Status: DC

## 2018-01-22 MED ORDER — THIAMINE HCL 100 MG/ML IJ SOLN: 100.0000 mg | Freq: Every day | INTRAMUSCULAR | Status: DC

## 2018-01-22 MED ORDER — IBUPROFEN 200 MG PO TABS: 600.0000 mg | ORAL_TABLET | Freq: Three times a day (TID) | ORAL | Status: DC | PRN

## 2018-01-22 MED ORDER — ALUM & MAG HYDROXIDE-SIMETH 200-200-20 MG/5ML PO SUSP: 30.0000 mL | Freq: Four times a day (QID) | ORAL | Status: DC | PRN

## 2018-01-22 NOTE — Progress Notes (Signed)
Contacted provider, Nanavati, no answer. Could not locate nurse, Jonny Ruiz.

## 2018-01-22 NOTE — ED Provider Notes (Signed)
[facility]-EMERGENCY DEPT Provider Note   CSN: [id number] Arrival date & time: 01/22/18  1804   History   Chief Complaint Chief Complaint  Patient presents with  . Depression    HPI Francisco Anderson is a 52 y.o. male with a past medical history significant for mood disorder who presents for evaluation of depression.  Patient states he has been hearing voices for the last 2 weeks.  The voices are telling him to kill himself.  Denies visual or tactile hallucinations.  Denies homicidal ideation.  Patient states he has thought of running out into traffic or trying to hang himself.  States he has had increased depression over the last 4 weeks without a known cause.  States he has not been taking his psychiatric medications.  Patient states he does not know what he supposed to be taking at this time.  Patient states he did drink 1/5 of liquor earlier today.  States he does have a history of intermittent cocaine use, however the last time he used was approximately 1 week ago.  Admits to a history of prior suicide attempt.  Denies fever, chills, headache, blurred vision, chest pain, shortness of breath, abdominal pain, nausea, vomiting, dysuria or diarrhea. Patient states he would like to seek inpatient treatment for his depression.  Level 5 caveat due to intoxication.  HPI  Past Medical History:  Diagnosis Date  . Mood disorder (HCC) 08/21/2017    Patient Active Problem List   Diagnosis Date Noted  . Syncope 08/21/2017  . Mood disorder (HCC) 08/21/2017    Past Surgical History:  Procedure Laterality Date  . FRACTURE SURGERY          Home Medications    Prior to Admission medications   Medication Sig Start Date End Date Taking? Authorizing Provider  divalproex (DEPAKOTE) 250 MG DR tablet Take 250 mg by mouth at bedtime.   Yes [provider]  QUEtiapine (SEROQUEL) 50 MG tablet Take 3 tablets (150 mg total) by mouth at bedtime. 08/22/17  Yes Briant Cedar, MD  sertraline (ZOLOFT) 25 MG tablet Take 25 mg by mouth daily. 08/30/17  Yes [provider]  meclizine (ANTIVERT) 25 MG tablet Take 1 tablet (25 mg total) by mouth 3 (three) times daily as needed for dizziness. Patient not taking: Reported on 01/22/2018 08/25/17   Derwood Kaplan, MD  Menthol, Topical Analgesic, (ICY HOT NATURALS) 7.5 % CREA Apply 1 application topically 3 (three) times daily as needed. Patient not taking: Reported on 01/22/2018 08/17/17   Georgiana Shore, PA-C  methocarbamol (ROBAXIN) 500 MG tablet Take 1 tablet (500 mg total) by mouth at bedtime as needed. Patient not taking: Reported on 01/22/2018 08/17/17   Mathews Robinsons B, PA-C  naproxen (NAPROSYN) 500 MG tablet Take 1 tablet (500 mg total) by mouth 2 (two) times daily with a meal. Patient not taking: Reported on 01/22/2018 08/17/17   Georgiana Shore, PA-C  nicotine (NICODERM CQ - DOSED IN MG/24 HOURS) 21 mg/24hr patch Place 1 patch (21 mg total) onto the skin daily. Patient not taking: Reported on 01/22/2018 08/23/17   Briant Cedar, MD  predniSONE (DELTASONE) 10 MG tablet Take 6 tablets (60 mg total) by mouth daily. Patient not taking: Reported on 01/22/2018 08/25/17   Derwood Kaplan, MD    Family History History reviewed. No pertinent family history.  Social History Social History   Tobacco Use  . Smoking status: Current Every Day Smoker    Packs/day: 2.00  .  Smokeless tobacco: Never Used  Substance Use Topics  . Alcohol use: Yes    Comment: Last use 11-02-17 2 40 oz beer   . Drug use: Yes    Types: Cocaine    Comment: yesterday crack cocaine 11-02-17     Allergies   Patient has no known allergies.   Review of Systems Review of Systems  Constitutional: Negative.   Respiratory: Negative.   Cardiovascular: Negative.   Gastrointestinal: Negative.   Genitourinary: Negative.   Musculoskeletal: Negative.   Skin: Positive for color change.  Neurological: Negative.     Psychiatric/Behavioral: Positive for suicidal ideas.  All other systems reviewed and are negative.    Physical Exam Updated Vital Signs BP 123/87   Pulse 99   Temp 98.2 F (36.8 C) (Oral)   Resp 17   Ht 5\' 8"  (1.727 m)   SpO2 95%   BMI 28.43 kg/m   Physical Exam  Constitutional: He appears well-developed and well-nourished. No distress.  HENT:  Head: Atraumatic.  Eyes: Pupils are equal, round, and reactive to light.  Neck: Normal range of motion. Neck supple.  Cardiovascular: Normal rate, regular rhythm, normal heart sounds and intact distal pulses. Exam reveals no gallop and no friction rub.  No murmur heard. Pulmonary/Chest: Effort normal and breath sounds normal. No stridor. No respiratory distress. He has no wheezes. He has no rales. He exhibits no tenderness.  Abdominal: Soft. Bowel sounds are normal. He exhibits no distension and no mass. There is no tenderness. There is no rebound and no guarding.  Musculoskeletal: Normal range of motion.  Neurological: He is alert.  Skin: Skin is warm and dry. He is not diaphoretic.  Psychiatric: He has a normal mood and affect. His speech is slurred. He is slowed. Thought content is not paranoid. He expresses suicidal ideation. He expresses no homicidal ideation. He expresses suicidal plans. He expresses no homicidal plans.  Nursing note and vitals reviewed.    ED Treatments / Results  Labs (all labs ordered are listed, but only abnormal results are displayed) Labs Reviewed  COMPREHENSIVE METABOLIC PANEL - Abnormal; Notable for the following components:      Result Value   Glucose, Bld 114 (*)    Calcium 8.7 (*)    All other components within normal limits  ETHANOL - Abnormal; Notable for the following components:   Alcohol, Ethyl (B) 127 (*)    All other components within normal limits  ACETAMINOPHEN LEVEL - Abnormal; Notable for the following components:   Acetaminophen (Tylenol), Serum <10 (*)    All other components  within normal limits  CBC - Abnormal; Notable for the following components:   RBC 6.12 (*)    All other components within normal limits  SALICYLATE LEVEL  RAPID URINE DRUG SCREEN, HOSP PERFORMED    EKG None  Radiology No results found.  Procedures Procedures (including critical care time)  Medications Ordered in ED Medications  LORazepam (ATIVAN) injection 0-4 mg (1 mg Intravenous Given 01/22/18 2319)    Or  LORazepam (ATIVAN) tablet 0-4 mg ( Oral See Alternative 01/22/18 2319)  LORazepam (ATIVAN) injection 0-4 mg (has no administration in time range)    Or  LORazepam (ATIVAN) tablet 0-4 mg (has no administration in time range)  thiamine (VITAMIN B-1) tablet 100 mg (has no administration in time range)    Or  thiamine (B-1) injection 100 mg (has no administration in time range)  ibuprofen (ADVIL,MOTRIN) tablet 600 mg (has no administration in time range)  ondansetron (  ZOFRAN) tablet 4 mg (has no administration in time range)  alum & mag hydroxide-simeth (MAALOX/MYLANTA) 200-200-20 MG/5ML suspension 30 mL (has no administration in time range)  nicotine (NICODERM CQ - dosed in mg/24 hours) patch 21 mg (has no administration in time range)     Initial Impression / Assessment and Plan / ED Course  I have reviewed the triage vital signs and the nursing notes.  Pertinent labs & imaging results that were available during my care of the patient were reviewed by me and considered in my medical decision making (see chart for details).  52 year old male with history of mood disorder presents for evaluation of depression.  Currently intoxicated on exam.  Admits to history of SI with plan.  History of prior suicide attempt. States he would like to seek treatment for his depression.  Will obtain labs, urine and will plan for TTS evaluation once patient is not clinically intoxicated.  UDS negative, Alcohol 127, Acetaminophen, Salicylate negative, CMP with mildly elevated glucose. Patient  able to walk to bathroom without difficulty.  Patient medically cleared for TTS evaluation.    Final Clinical Impressions(s) / ED Diagnoses   Final diagnoses:  Suicidal ideations  Alcoholic intoxication without complication [facility])    ED Discharge Orders    None       Silverio Hagan A, PA-C 01/22/18 2328    Arby Barrette, MD 01/23/18 1721

## 2018-01-22 NOTE — BH Assessment (Signed)
Assessment Note  Francisco Anderson is an 52 y.o., single male. Pt presented to [facility] via EMS voluntarily and alone. Pt reports that he is suicidal and needs treatment. Pt stated that he drank 2 40oz beers. Pt reports depression, with suicide attempt 3 days ago by way of overdose. Pt reports that he has attempted 5 times. Pt stated, "I want to go over to [facility]." Pt stated that he is hearing voices, of men and women, telling him that he should go ahead and kill himself. Pt stated that he also uses Cocaine, which he last used $15 3 days ago. Pt denied hx of trauma. Pt reports not sleeping and fair appetite. Pt reports insomnia, guilt, feelings of hopelessness, irritability. Pt report hx of self harm. Pt reports that he is a patient of [facility] for MM and OPT, but stated that he does not attend consistently. Pt stated that he is not taking MH medications prescribed to him.   Pt reports that he is homeless and receives SSI. Pt reports that he did not follow up with the [facility] from a previous referral. Pt reports that he has not supports in the community. Pt reports that he did not appear for his recent court date.   Pt oriented to person, place, time and situation. Pt presented alert, not well groomed and having a severe odor. Pt spoke clearly, coherently and did not seem to be under the influence of any substances. Pt made good eye contact and answered questions appropriately. Pt presented with flat affect, but was irritable when clinician did not confirm that he would be admitted. Pt presented with no impairments of remote or recent memory.   Diagnosis: F33.2 Major depressive disorder, Recurrent episode, Severe F14.20 Cocaine use disorder, Moderate F10.20 Alcohol use disorder, Severe   Past Medical History:  Past Medical History:  Diagnosis Date  . Mood disorder (HCC) 08/21/2017    Past Surgical History:  Procedure Laterality Date  . FRACTURE SURGERY      Family History: History  reviewed. No pertinent family history.  Social History:  reports that he has been smoking. He has been smoking about 2.00 packs per day. He has never used smokeless tobacco. He reports that he drinks alcohol. He reports that he has current or past drug history. Drug: Cocaine.  Additional Social History:  Alcohol / Drug Use Pain Medications: SEE MAR Prescriptions: Pt stated that he is prescribed medications, but has not gotten them. Over the Counter: SEE MAR History of alcohol / drug use?: Yes Longest period of sobriety (when/how long): None reported.  Substance #1 Name of Substance 1: Alcohol  1 - Age of First Use: Pt reported twenties.  1 - Amount (size/oz): 2 40oz Beers 1 - Frequency: Daily  1 - Duration: Decades 1 - Last Use / Amount: 01/22/2018 Substance #2 Name of Substance 2: Cocaine 2 - Age of First Use: Pt stated 20's.  2 - Amount (size/oz): $15 [place]  2 - Frequency: A Couple Times Monthly.  2 - Duration: Decades 2 - Last Use / Amount: 3 Days Ago   CIWA: CIWA-Ar BP: 123/87 Pulse Rate: 99 Nausea and Vomiting: mild nausea with no vomiting Tactile Disturbances: none Tremor: not visible, but can be felt fingertip to fingertip Auditory Disturbances: very mild harshness or ability to frighten Paroxysmal Sweats: no sweat visible Visual Disturbances: not present Anxiety: mildly anxious Headache, Fullness in Head: very mild Agitation: two Orientation and Clouding of Sensorium: disoriented for data by no more than  2 calendar days CIWA-Ar Total: 9 COWS:    Allergies: No Known Allergies  Home Medications:  (Not in a hospital admission)  OB/GYN Status:  No LMP for male patient.  General Assessment Data Location of Assessment: [facility] ED TTS Assessment: In system Is this a Tele or Face-to-Face Assessment?: Face-to-Face Is this an Initial Assessment or a Re-assessment for this encounter?: Initial Assessment Patient Accompanied by:: Other(ALone ) Language Other than English:  No Living Arrangements: Homeless/Shelter What gender do you identify as?: Male Marital status: Single Maiden name: N/A Pregnancy Status: No Living Arrangements: Alone Can pt return to current living arrangement?: Yes Admission Status: Voluntary Is patient capable of signing voluntary admission?: Yes Referral Source: Self/Family/Friend Insurance type: Medicaid   Medical Screening Exam [facility] Walk-in ONLY) Medical Exam completed: Yes  Crisis Care Plan Living Arrangements: Alone Legal Guardian: Other:(Self) Name of Psychiatrist: [facility] Name of Therapist: [facility]  Education Status Is patient currently in school?: No Is the patient employed, unemployed or receiving disability?: Receiving disability income  Risk to self with the past 6 months Suicidal Ideation: Yes-Currently Present Has patient been a risk to self within the past 6 months prior to admission? : Yes Suicidal Intent: Yes-Currently Present Has patient had any suicidal intent within the past 6 months prior to admission? : Yes Is patient at risk for suicide?: Yes Suicidal Plan?: No Has patient had any suicidal plan within the past 6 months prior to admission? : Yes Access to Means: Yes Specify Access to Suicidal Means: Over the Counter Medications per report.  What has been your use of drugs/alcohol within the last 12 months?: Alcohol and Cocaine use. Previous Attempts/Gestures: Yes How many times?: 5 Other Self Harm Risks: Pt denied.  Triggers for Past Attempts: None known Intentional Self Injurious Behavior: None Family Suicide History: No Recent stressful life event(s): Other (Comment)(Homeless ) Persecutory voices/beliefs?: No Depression: Yes Depression Symptoms: Despondent, Insomnia, Tearfulness, Fatigue, Guilt, Loss of interest in usual pleasures, Feeling worthless/self pity, Feeling angry/irritable Substance abuse history and/or treatment for substance abuse?: No Suicide prevention information given to  non-admitted patients: Yes  Risk to Others within the past 6 months Homicidal Ideation: No Does patient have any lifetime risk of violence toward others beyond the six months prior to admission? : No Thoughts of Harm to Others: No Current Homicidal Intent: No Current Homicidal Plan: No Access to Homicidal Means: No Identified Victim: Denied.  History of harm to others?: No Assessment of Violence: None Noted Violent Behavior Description: Denied.  Does patient have access to weapons?: No Criminal Charges Pending?: Yes Describe Pending Criminal Charges: Failure to Appear; Open Container Does patient have a court date: (Pt did not appear on 11/09/2017 per report. ) Is patient on probation?: No  Psychosis Hallucinations: Auditory Delusions: None noted  Mental Status Report Appearance/Hygiene: Poor hygiene, Body odor Eye Contact: Good Motor Activity: Unremarkable Speech: Unremarkable Level of Consciousness: Alert Mood: Irritable Affect: Irritable Anxiety Level: None Thought Processes: Coherent, Relevant Judgement: Impaired Orientation: Person, Place, Time, Situation, Appropriate for developmental age Obsessive Compulsive Thoughts/Behaviors: None  Cognitive Functioning Concentration: Normal Memory: Recent Intact, Remote Intact Is patient IDD: No Insight: Fair Impulse Control: Poor Appetite: Fair Have you had any weight changes? : No Change Sleep: Decreased Vegetative Symptoms: None  ADLScreening [facility] Assessment Services) Patient's cognitive ability adequate to safely complete daily activities?: Yes Patient able to express need for assistance with ADLs?: Yes Independently performs ADLs?: Yes (appropriate for developmental age)  Prior Inpatient Therapy Prior Inpatient Therapy: Yes Prior  Therapy Dates: Pt unsure.  Prior Therapy Facilty/Provider(s): Pt unsure.  Reason for Treatment: Depression  Prior Outpatient Therapy Prior Outpatient Therapy: Yes Prior Therapy Dates:  Current  Prior Therapy Facilty/Provider(s): [facility] Reason for Treatment: Depression Does patient have an ACCT team?: No Does patient have Intensive In-House Services?  : No Does patient have [facility] services? : Yes Does patient have P4CC services?: No  ADL Screening (condition at time of admission) Patient's cognitive ability adequate to safely complete daily activities?: Yes Is the patient deaf or have difficulty hearing?: No Does the patient have difficulty seeing, even when wearing glasses/contacts?: No Does the patient have difficulty concentrating, remembering, or making decisions?: No Patient able to express need for assistance with ADLs?: Yes Does the patient have difficulty dressing or bathing?: No Independently performs ADLs?: Yes (appropriate for developmental age) Does the patient have difficulty walking or climbing stairs?: No Weakness of Legs: None Weakness of Arms/Hands: None  Home Assistive Devices/Equipment Home Assistive Devices/Equipment: None  Therapy Consults (therapy consults require a physician order) PT Evaluation Needed: No OT Evalulation Needed: No SLP Evaluation Needed: No Abuse/Neglect Assessment (Assessment to be complete while patient is alone) Abuse/Neglect Assessment Can Be Completed: Yes Physical Abuse: Denies Verbal Abuse: Denies Sexual Abuse: Denies Exploitation of patient/patient's resources: Denies Self-Neglect: Denies Values / Beliefs Cultural Requests During Hospitalization: None Consults Spiritual Care Consult Needed: No Social Work Consult Needed: No Merchant navy officer (For Healthcare) Does Patient Have a Medical Advance Directive?: No Would patient like information on creating a medical advance directive?: No - Patient declined          Disposition: Per Donell Sievert, PA; Pt to be held for observation and discharged in the morning with resources for OPT.  Disposition Initial Assessment Completed for this Encounter:  Yes Patient referred to: Other (Comment)(Pt can follow up with current provider, Vesta Mixer. )  Chesley Noon, M.S., [facility], LCAS Triage Specialist [facility] 01/22/2018 11:20 PM

## 2018-01-22 NOTE — ED Notes (Signed)
Bed: [facility] Expected date:  Expected time:  Means of arrival:  Comments: 

## 2018-01-22 NOTE — ED Notes (Signed)
Meal given

## 2018-01-22 NOTE — ED Triage Notes (Signed)
[place]. Pt has had 2 alcoholic beverages. Pt states they want resources for mental health and has depression. Hx of mental illness. Pt is still intoxicated per EMS.  BP 120/80, HR 85, SpO2 97%, CBG 158.

## 2018-01-23 NOTE — ED Notes (Signed)
Pt resting in bed with even unlabored respirations. No distress noted. Will continue to monitor.  

## 2018-01-23 NOTE — ED Notes (Signed)
Pt oriented to unit. Pt states he wants to sleep. Pt reports he has SI secondary to command AH. Pt is unable to state what voices are specifically saying. Pt does not appear to be responding to internal stimuli. Pt contracts for safety

## 2018-01-23 NOTE — ED Notes (Signed)
Pt discharged ambulatory with multiple resources.  Pt was in no distress at discharge.  All belongings were sent with pt.

## 2018-01-23 NOTE — Progress Notes (Signed)
Dr. Nanavati informed of disposition.  

## 2018-01-23 NOTE — Progress Notes (Signed)
Per Dr. Sharma Covert and Malachy Chamber, NP patient is psychiatrically cleared. Discharge with outpatient resources.

## 2018-06-06 ENCOUNTER — Encounter (HOSPITAL_COMMUNITY): Payer: Self-pay | Admitting: Emergency Medicine

## 2018-06-06 ENCOUNTER — Emergency Department (HOSPITAL_COMMUNITY)
Admission: EM | Admit: 2018-06-06 | Discharge: 2018-06-07 | Disposition: A | Discharge status: Other Acute Inpatient Hospital | Payer: No Typology Code available for payment source | Attending: Emergency Medicine | Admitting: Emergency Medicine

## 2018-06-06 DIAGNOSIS — F333 Major depressive disorder, recurrent, severe with psychotic symptoms: Secondary | ICD-10-CM | POA: Insufficient documentation

## 2018-06-06 DIAGNOSIS — F17210 Nicotine dependence, cigarettes, uncomplicated: Secondary | ICD-10-CM | POA: Insufficient documentation

## 2018-06-06 DIAGNOSIS — Z79899 Other long term (current) drug therapy: Secondary | ICD-10-CM | POA: Insufficient documentation

## 2018-06-06 DIAGNOSIS — L989 Disorder of the skin and subcutaneous tissue, unspecified: Secondary | ICD-10-CM

## 2018-06-06 DIAGNOSIS — F1020 Alcohol dependence, uncomplicated: Secondary | ICD-10-CM | POA: Diagnosis not present

## 2018-06-06 DIAGNOSIS — R45851 Suicidal ideations: Secondary | ICD-10-CM

## 2018-06-06 DIAGNOSIS — F1010 Alcohol abuse, uncomplicated: Secondary | ICD-10-CM

## 2018-06-06 HISTORY — DX: Bipolar disorder, unspecified: F31.9

## 2018-06-06 HISTORY — DX: Brief psychotic disorder: F23

## 2018-06-06 LAB — CBC
HCT: 50.3 % (ref 39.0–52.0)
Hemoglobin: 16.1 g/dL (ref 13.0–17.0)
MCH: 27.6 pg (ref 26.0–34.0)
MCHC: 32 g/dL (ref 30.0–36.0)
MCV: 86.3 fL (ref 80.0–100.0)
PLATELETS: 133 10*3/uL — AB (ref 150–400)
RBC: 5.83 MIL/uL — ABNORMAL HIGH (ref 4.22–5.81)
RDW: 14.3 % (ref 11.5–15.5)
WBC: 5.8 10*3/uL (ref 4.0–10.5)
nRBC: 0 % (ref 0.0–0.2)

## 2018-06-06 LAB — SALICYLATE LEVEL

## 2018-06-06 LAB — COMPREHENSIVE METABOLIC PANEL
ALT: 92 U/L — ABNORMAL HIGH (ref 0–44)
ANION GAP: 10 (ref 5–15)
AST: 132 U/L — AB (ref 15–41)
Albumin: 3.5 g/dL (ref 3.5–5.0)
Alkaline Phosphatase: 90 U/L (ref 38–126)
BUN: 6 mg/dL (ref 6–20)
CO2: 25 mmol/L (ref 22–32)
Calcium: 8.7 mg/dL — ABNORMAL LOW (ref 8.9–10.3)
Chloride: 102 mmol/L (ref 98–111)
Creatinine, Ser: 0.65 mg/dL (ref 0.61–1.24)
Glucose, Bld: 98 mg/dL (ref 70–99)
POTASSIUM: 3.6 mmol/L (ref 3.5–5.1)
Sodium: 137 mmol/L (ref 135–145)
Total Bilirubin: 0.5 mg/dL (ref 0.3–1.2)
Total Protein: 6.8 g/dL (ref 6.5–8.1)

## 2018-06-06 LAB — ACETAMINOPHEN LEVEL

## 2018-06-06 LAB — ETHANOL: Alcohol, Ethyl (B): 79 mg/dL — ABNORMAL HIGH (ref <10)

## 2018-06-06 MED ORDER — LORAZEPAM 1 MG PO TABS
0.0000 mg | ORAL_TABLET | Freq: Four times a day (QID) | ORAL | Status: DC
  Administered 2018-06-06: 2 mg via ORAL
  Filled 2018-06-06: qty 1
  Filled 2018-06-06: qty 2

## 2018-06-06 MED ORDER — THIAMINE HCL 100 MG/ML IJ SOLN: 100.0000 mg | Freq: Every day | INTRAMUSCULAR | Status: DC

## 2018-06-06 MED ORDER — LORAZEPAM 2 MG/ML IJ SOLN: 0.0000 mg | Freq: Two times a day (BID) | INTRAMUSCULAR | Status: DC

## 2018-06-06 MED ORDER — ZOLPIDEM TARTRATE 5 MG PO TABS
5.0000 mg | ORAL_TABLET | Freq: Every evening | ORAL | Status: DC | PRN
  Administered 2018-06-06: 5 mg via ORAL
  Filled 2018-06-06: qty 1

## 2018-06-06 MED ORDER — LORAZEPAM 1 MG PO TABS: 1.0000 mg | ORAL_TABLET | Freq: Once | ORAL | Status: DC

## 2018-06-06 MED ORDER — ONDANSETRON HCL 4 MG PO TABS: 4.0000 mg | ORAL_TABLET | Freq: Three times a day (TID) | ORAL | Status: DC | PRN

## 2018-06-06 MED ORDER — LORAZEPAM 2 MG/ML IJ SOLN: 0.0000 mg | Freq: Four times a day (QID) | INTRAMUSCULAR | Status: DC

## 2018-06-06 MED ORDER — ACETAMINOPHEN 325 MG PO TABS: 650.0000 mg | ORAL_TABLET | ORAL | Status: DC | PRN

## 2018-06-06 MED ORDER — LORAZEPAM 1 MG PO TABS
0.0000 mg | ORAL_TABLET | Freq: Two times a day (BID) | ORAL | Status: DC
  Administered 2018-06-07: 1 mg via ORAL

## 2018-06-06 MED ORDER — VITAMIN B-1 100 MG PO TABS
100.0000 mg | ORAL_TABLET | Freq: Every day | ORAL | Status: DC
  Administered 2018-06-06 – 2018-06-07 (×2): 100 mg via ORAL
  Filled 2018-06-06 (×2): qty 1

## 2018-06-06 NOTE — ED Triage Notes (Signed)
[place] ed [facility] [place] [facility]- wants to be seen for detox and suicidal ideations,  First question pt asked was if this nurse could give him a menu - and then if he and his friend could go to the cafeteria- pt is agitated and semi cooperative.  Before pt and friend could come into hospital from EMS they both had to go smoke.

## 2018-06-06 NOTE — ED Notes (Signed)
Patient denies pain and is resting comfortably.  

## 2018-06-06 NOTE — ED Notes (Signed)
Patient was given Sprite and [place] Sandwich bag.

## 2018-06-06 NOTE — ED Notes (Signed)
Pt in room. Slammed door. In to speak with pt. Pt wants door to remain closed. Told pt that it has to remain open. Placed paper under door to prop door open.

## 2018-06-06 NOTE — BH Assessment (Addendum)
Assessment Note  Francisco Anderson is an 53 y.o. male.  The pt came in due to having suicidal thoughts.  The pt stated he would stab himself or shoot himself if he had a sharp knife or a gun.  The pt reported that he doesn't have access to a gun or knife.  The pt also stated he is having thoughts of overdosing on pills or walking in front of traffic.  He stated he is upset because he has been homeless for the past 8 months.  The pt stated he is drinking about 2 cases of beer a day.  Today he had about a case of beer and his blood alcohol level was 79.  The pt has attempted suicide in the past by cutting himself.  He stated he was last hospitalized in 2016.  He was last at [facility] in 2007.  He isn't currently seeing a counselor or psychiatrist.  The pt denies self harm and HI.  He stated he has a court date in April of an open container.  The pt reports hearing voices telling him to take his life.  The pt stated he isn't sleeping well at night.  He denies any other SA and his UDS hasn't been completed at this time.  Pt is dressed in scrubs and he has a strong body odor. He is alert and oriented x4. Pt speaks in a clear tone, at moderate volume and normal pace. Eye contact is good. Pt's mood is depressed. Thought process is coherent and relevant. There is no indication Pt is currently responding to internal stimuli or experiencing delusional thought content.?Pt was cooperative throughout assessment.      Diagnosis: F33.3 Major depressive disorder, Recurrent episode, With psychotic features F10.20 Alcohol use disorder, Severe  Past Medical History:  Past Medical History:  Diagnosis Date  . Bipolar affective (HCC)   . Mood disorder (HCC) 08/21/2017  . Schizophrenia, acute [facility])     Past Surgical History:  Procedure Laterality Date  . FRACTURE SURGERY      Family History: No family history on file.  Social History:  reports that he has been smoking. He has been smoking about 2.00 packs per  day. He has never used smokeless tobacco. He reports current alcohol use. He reports current drug use. Drug: Cocaine.  Additional Social History:  Alcohol / Drug Use Pain Medications: See MAR Prescriptions: See MAR Over the Counter: See MAR History of alcohol / drug use?: Yes Longest period of sobriety (when/how long): unknown Substance #1 Name of Substance 1: alcohol 1 - Age of First Use: unknown 1 - Amount (size/oz): 2 cases of beer 1 - Frequency: daily 1 - Last Use / Amount: 06/06/2018  CIWA: CIWA-Ar BP: 117/81 Pulse Rate: (!) 103 Nausea and Vomiting: no nausea and no vomiting Tactile Disturbances: mild itching, pins and needles, burning or numbness Tremor: two Auditory Disturbances: very mild harshness or ability to frighten Paroxysmal Sweats: barely perceptible sweating, palms moist Visual Disturbances: not present Anxiety: three Headache, Fullness in Head: very mild Agitation: normal activity Orientation and Clouding of Sensorium: oriented and can do serial additions CIWA-Ar Total: 10 COWS:    Allergies: No Known Allergies  Home Medications: (Not in a hospital admission)   OB/GYN Status:  No LMP for male patient.  General Assessment Data Location of Assessment: [facility] ED TTS Assessment: In system Is this a Tele or Face-to-Face Assessment?: Face-to-Face Is this an Initial Assessment or a Re-assessment for this encounter?: Initial Assessment Patient Accompanied  by:: N/A Language Other than English: No Living Arrangements: Homeless/Shelter What gender do you identify as?: Male Marital status: Single Living Arrangements: Other (Comment)(homeless) Can pt return to current living arrangement?: Yes Admission Status: Voluntary Is patient capable of signing voluntary admission?: Yes Referral Source: Self/Family/Friend Insurance type: medicaid     Crisis Care Plan Living Arrangements: Other (Comment)(homeless) Legal Guardian: Other:(Self) Name of Psychiatrist:  none Name of Therapist: none  Education Status Is patient currently in school?: No Is the patient employed, unemployed or receiving disability?: Unemployed  Risk to self with the past 6 months Suicidal Ideation: Yes-Currently Present Has patient been a risk to self within the past 6 months prior to admission? : Yes Suicidal Intent: Yes-Currently Present Has patient had any suicidal intent within the past 6 months prior to admission? : Yes Is patient at risk for suicide?: Yes Suicidal Plan?: Yes-Currently Present Has patient had any suicidal plan within the past 6 months prior to admission? : Yes Specify Current Suicidal Plan: cut self with a knife, shoot self with a gun, walk in front of traffic  or OD Access to Means: Yes Specify Access to Suicidal Means: denies having a gun and sharp knife, but can get medication and can get to traffic What has been your use of drugs/alcohol within the last 12 months?: alcohol use Previous Attempts/Gestures: Yes How many times?: 1 Other Self Harm Risks: pt denies Triggers for Past Attempts: Unpredictable Intentional Self Injurious Behavior: None Family Suicide History: Unknown Recent stressful life event(s): Financial Problems, Other (Comment)(being homeless) Persecutory voices/beliefs?: Yes Depression: Yes Depression Symptoms: Insomnia, Despondent, Feeling worthless/self pity Substance abuse history and/or treatment for substance abuse?: Yes Suicide prevention information given to non-admitted patients: Not applicable  Risk to Others within the past 6 months Homicidal Ideation: No Does patient have any lifetime risk of violence toward others beyond the six months prior to admission? : No Thoughts of Harm to Others: No Current Homicidal Intent: No Current Homicidal Plan: No Access to Homicidal Means: No Identified Victim: pt denies History of harm to others?: No Assessment of Violence: None Noted Violent Behavior Description: none Does  patient have access to weapons?: No Criminal Charges Pending?: No Does patient have a court date: No Is patient on probation?: No  Psychosis Hallucinations: Auditory, With command Delusions: None noted  Mental Status Report Appearance/Hygiene: Body odor, Disheveled Eye Contact: Fair Motor Activity: Unable to assess Speech: Logical/coherent Level of Consciousness: Alert Mood: Depressed Affect: Appropriate to circumstance Anxiety Level: None Thought Processes: Coherent, Relevant Judgement: Impaired Orientation: Person, Place, Time, Situation Obsessive Compulsive Thoughts/Behaviors: None  Cognitive Functioning Concentration: Normal Memory: Recent Intact, Remote Intact Is patient IDD: No Insight: Poor Impulse Control: Poor Appetite: Fair Have you had any weight changes? : No Change Sleep: Decreased Total Hours of Sleep: 4 Vegetative Symptoms: None  ADLScreening [facility] Assessment Services) Patient's cognitive ability adequate to safely complete daily activities?: Yes Patient able to express need for assistance with ADLs?: Yes Independently performs ADLs?: Yes (appropriate for developmental age)  Prior Inpatient Therapy Prior Inpatient Therapy: Yes Prior Therapy Dates: 2016 and 2007 [facility] Reason for Treatment: SI  Prior Outpatient Therapy Prior Outpatient Therapy: Yes Prior Therapy Dates: 01/2018 Prior Therapy Facilty/Provider(s): [facility] Reason for Treatment: depression Does patient have an ACCT team?: No Does patient have Intensive In-House Services?  : No Does patient have [facility] services? : Yes Does patient have P4CC services?: No  ADL Screening (condition at time of admission) Patient's cognitive ability adequate to  safely complete daily activities?: Yes Patient able to express need for assistance with ADLs?: Yes Independently performs ADLs?: Yes (appropriate for developmental age)                         Disposition:  Disposition Initial Assessment Completed for this Encounter: Yes   PA Donell Sievert recommends the pt be observed overnight for safety and stabilization.  The pt is to be reassessed by psychiatry in the morning.   On Site Evaluation by:   Reviewed with Physician:    Ottis Stain 06/06/2018 7:55 PM

## 2018-06-06 NOTE — ED Provider Notes (Addendum)
[facility] EMERGENCY DEPARTMENT Provider Note   CSN: [id number] Arrival date & time: 06/06/18  1327    History   Chief Complaint Chief Complaint  Patient presents with  . Suicidal  . detox    HPI Francisco Anderson is a 53 y.o. male.     HPI 53 year old man states that he is suicidal.  He states that he has been depressed and homeless.  He has been drinking a lot of alcohol.  He would has thought of harming himself by knife, gun, or overdose.  He states he is previously attempted overdose.  He has not been taking his behavioral health medication since he ran out of them.  He states he also lost his wallet and has been unable to regain control of the check that he normally gets. He has been having some loss of urinary and rectal incontinence consistent with diarrhea.  He states that he has been on the streets and this is occurred in his clothing.  Secondary to this he has some wounds on his medial thighs. Patient drinks daily and last etoh this am.  Past Medical History:  Diagnosis Date  . Bipolar affective (HCC)   . Mood disorder (HCC) 08/21/2017  . Schizophrenia, acute [facility])     Patient Active Problem List   Diagnosis Date Noted  . Syncope 08/21/2017  . Mood disorder (HCC) 08/21/2017    Past Surgical History:  Procedure Laterality Date  . FRACTURE SURGERY          Home Medications    Prior to Admission medications   Medication Sig Start Date End Date Taking? Authorizing Provider  divalproex (DEPAKOTE) 250 MG DR tablet Take 250 mg by mouth at bedtime.    [provider]  meclizine (ANTIVERT) 25 MG tablet Take 1 tablet (25 mg total) by mouth 3 (three) times daily as needed for dizziness. Patient not taking: Reported on 01/22/2018 08/25/17   Derwood Kaplan, MD  Menthol, Topical Analgesic, (ICY HOT NATURALS) 7.5 % CREA Apply 1 application topically 3 (three) times daily as needed. Patient not taking: Reported on 01/22/2018 08/17/17    Georgiana Shore, PA-C  methocarbamol (ROBAXIN) 500 MG tablet Take 1 tablet (500 mg total) by mouth at bedtime as needed. Patient not taking: Reported on 01/22/2018 08/17/17   Mathews Robinsons B, PA-C  naproxen (NAPROSYN) 500 MG tablet Take 1 tablet (500 mg total) by mouth 2 (two) times daily with a meal. Patient not taking: Reported on 01/22/2018 08/17/17   Georgiana Shore, PA-C  nicotine (NICODERM CQ - DOSED IN MG/24 HOURS) 21 mg/24hr patch Place 1 patch (21 mg total) onto the skin daily. Patient not taking: Reported on 01/22/2018 08/23/17   Briant Cedar, MD  predniSONE (DELTASONE) 10 MG tablet Take 6 tablets (60 mg total) by mouth daily. Patient not taking: Reported on 01/22/2018 08/25/17   Derwood Kaplan, MD  QUEtiapine (SEROQUEL) 50 MG tablet Take 3 tablets (150 mg total) by mouth at bedtime. 08/22/17   Briant Cedar, MD  sertraline (ZOLOFT) 25 MG tablet Take 25 mg by mouth daily. 08/30/17   [provider]    Family History No family history on file.  Social History Social History   Tobacco Use  . Smoking status: Current Every Day Smoker    Packs/day: 2.00  . Smokeless tobacco: Never Used  Substance Use Topics  . Alcohol use: Yes    Comment: "as much as I can get"  . Drug use: Yes  Types: Cocaine    Comment: yesterday crack cocaine 11-02-17     Allergies   Patient has no known allergies.   Review of Systems Review of Systems  Gastrointestinal: Positive for diarrhea.  Skin: Positive for wound.  All other systems reviewed and are negative.    Physical Exam Updated Vital Signs BP 117/81 (BP Location: Right Arm)   Pulse (!) 103   Temp 98.2 F (36.8 C) (Oral)   Resp 20   SpO2 98%   Physical Exam Vitals signs and nursing note reviewed.  Constitutional:      Appearance: Normal appearance.     Comments: Appears unkempt  HENT:     Head: Normocephalic.     Right Ear: External ear normal.     Left Ear: External ear normal.     Nose:  Nose normal.     Mouth/Throat:     Mouth: Mucous membranes are moist.  Eyes:     Pupils: Pupils are equal, round, and reactive to light.  Neck:     Musculoskeletal: Normal range of motion.  Cardiovascular:     Rate and Rhythm: Normal rate and regular rhythm.  Pulmonary:     Effort: Pulmonary effort is normal.  Abdominal:     General: Abdomen is flat.  Musculoskeletal: Normal range of motion.     Comments: Skin breakdown bilateral medial thighs with some erythema  Skin:    General: Skin is warm.     Capillary Refill: Capillary refill takes less than 2 seconds.  Neurological:     General: No focal deficit present.     Mental Status: He is alert. Mental status is at baseline.     Cranial Nerves: No cranial nerve deficit.     Motor: No weakness.     Gait: Gait normal.  Psychiatric:        Attention and Perception: Attention normal.        Mood and Affect: Mood is depressed.        Behavior: Behavior normal.        Cognition and Memory: Cognition normal.      ED Treatments / Results  Labs (all labs ordered are listed, but only abnormal results are displayed) Labs Reviewed  COMPREHENSIVE METABOLIC PANEL - Abnormal; Notable for the following components:      Result Value   Calcium 8.7 (*)    AST 132 (*)    ALT 92 (*)    All other components within normal limits  ETHANOL - Abnormal; Notable for the following components:   Alcohol, Ethyl (B) 79 (*)    All other components within normal limits  CBC - Abnormal; Notable for the following components:   RBC 5.83 (*)    Platelets 133 (*)    All other components within normal limits  SALICYLATE LEVEL  ACETAMINOPHEN LEVEL  RAPID URINE DRUG SCREEN, HOSP PERFORMED    EKG None  Radiology No results found.  Procedures Procedures (including critical care time)  Medications Ordered in ED Medications - No data to display   Initial Impression / Assessment and Plan / ED Course  I have reviewed the triage vital signs and the  nursing notes.  Pertinent labs & imaging results that were available during my care of the patient were reviewed by me and considered in my medical decision making (see chart for details).        Vitals:   06/06/18 1332  BP: 117/81  Pulse: (!) 103  Resp: 20  Temp: 98.2  F (36.8 C)  SpO2: 98%   Patient here with si- plan psych evaluation Patient has had some diarrhea with secondary skin break down from this.  Wounds do not appear infected. Patient without loose stools here and taking po Patient ho etoh abuse and at risk for withdrawal- will start detox protocol  Elevated transaminases  Final Clinical Impressions(s) / ED Diagnoses   Final diagnoses:  Suicidal ideation  Skin lesion of left lower extremity  Alcohol abuse    ED Discharge Orders    None       Margarita Grizzle, MD 06/06/18 Elfredia Nevins    Margarita Grizzle, MD 06/07/18 0028

## 2018-06-06 NOTE — ED Notes (Signed)
Pts belongings inventoried and two bags placed in locker #2. Pt has valuables given to security. Pt refused to sign acknowledgement of inventoried belongings and valuables.

## 2018-06-07 ENCOUNTER — Other Ambulatory Visit: Payer: Self-pay

## 2018-06-07 ENCOUNTER — Other Ambulatory Visit: Payer: Self-pay | Admitting: Registered Nurse

## 2018-06-07 ENCOUNTER — Encounter (HOSPITAL_COMMUNITY): Payer: Self-pay

## 2018-06-07 ENCOUNTER — Encounter (HOSPITAL_COMMUNITY): Payer: Self-pay | Admitting: Registered Nurse

## 2018-06-07 ENCOUNTER — Inpatient Hospital Stay (HOSPITAL_COMMUNITY)
Admission: AD | Admit: 2018-06-07 | Discharge: 2018-06-08 | DRG: 885 | Disposition: A | Discharge status: Home / Self Care | Payer: No Typology Code available for payment source | Source: Intra-hospital | Attending: Psychiatry | Admitting: Psychiatry

## 2018-06-07 DIAGNOSIS — F333 Major depressive disorder, recurrent, severe with psychotic symptoms: Secondary | ICD-10-CM | POA: Diagnosis present

## 2018-06-07 DIAGNOSIS — F319 Bipolar disorder, unspecified: Secondary | ICD-10-CM | POA: Diagnosis present

## 2018-06-07 DIAGNOSIS — Z915 Personal history of self-harm: Secondary | ICD-10-CM

## 2018-06-07 DIAGNOSIS — F17210 Nicotine dependence, cigarettes, uncomplicated: Secondary | ICD-10-CM | POA: Diagnosis present

## 2018-06-07 DIAGNOSIS — Z590 Homelessness: Secondary | ICD-10-CM

## 2018-06-07 DIAGNOSIS — R45851 Suicidal ideations: Secondary | ICD-10-CM | POA: Diagnosis present

## 2018-06-07 DIAGNOSIS — Y903 Blood alcohol level of 60-79 mg/100 ml: Secondary | ICD-10-CM | POA: Diagnosis present

## 2018-06-07 DIAGNOSIS — Z79899 Other long term (current) drug therapy: Secondary | ICD-10-CM | POA: Diagnosis not present

## 2018-06-07 DIAGNOSIS — F1020 Alcohol dependence, uncomplicated: Secondary | ICD-10-CM | POA: Diagnosis present

## 2018-06-07 DIAGNOSIS — F209 Schizophrenia, unspecified: Secondary | ICD-10-CM | POA: Diagnosis present

## 2018-06-07 LAB — RAPID URINE DRUG SCREEN, HOSP PERFORMED
Amphetamines: NOT DETECTED
Barbiturates: NOT DETECTED
Benzodiazepines: NOT DETECTED
Cocaine: NOT DETECTED
Opiates: NOT DETECTED
TETRAHYDROCANNABINOL: NOT DETECTED

## 2018-06-07 MED ORDER — HYDROXYZINE HCL 25 MG PO TABS
25.0000 mg | ORAL_TABLET | Freq: Three times a day (TID) | ORAL | Status: DC | PRN
  Administered 2018-06-08: 25 mg via ORAL
  Filled 2018-06-07: qty 1

## 2018-06-07 MED ORDER — ALUM & MAG HYDROXIDE-SIMETH 200-200-20 MG/5ML PO SUSP: 30.0000 mL | ORAL | Status: DC | PRN

## 2018-06-07 MED ORDER — QUETIAPINE FUMARATE 25 MG PO TABS: 25.0000 mg | ORAL_TABLET | Freq: Two times a day (BID) | ORAL | Status: DC

## 2018-06-07 MED ORDER — MAGNESIUM HYDROXIDE 400 MG/5ML PO SUSP: 30.0000 mL | Freq: Every day | ORAL | Status: DC | PRN

## 2018-06-07 MED ORDER — TRAZODONE HCL 50 MG PO TABS: 50.0000 mg | ORAL_TABLET | Freq: Every day | ORAL | Status: DC

## 2018-06-07 MED ORDER — TRAZODONE HCL 50 MG PO TABS
50.0000 mg | ORAL_TABLET | Freq: Every evening | ORAL | Status: DC | PRN
  Administered 2018-06-07: 50 mg via ORAL

## 2018-06-07 NOTE — Progress Notes (Signed)
Patient ID: Francisco Anderson, male   DOB: 01/21/1966, 53 y.o.   MRN: [id number] Pt is a 53 yo male that presents IVC'd on 06/07/2018 with an attempt to cut his wrist after worsening depression and drinking 1-2 cases of beer/day. Pt states that he has been homeless for about a year. Pt received an ssi check before this, but lost his wallet and would like help starting this back. Pt states he has had multiple prison sentences with B & E's in his past. Pt has complaints of depression, worthlessness, anxiety, hopelessness, and and self harm thoughts. Pt denies past/present physical/verbal/sexual abuse. Pt states he smokes 2 packs/day. Pt denies any drug/Rx abuse/use. Pt endorses AH. Pt denies any other medical hx. Pt states he has permament upper teeth. Pt's skin assessment revealed that he had abrasions to his inner thighs bilaterally that he states is skin breakdown from urinating on himself while drunk. Pt states that is why he came into the ED, to get this cleaned. Pt denies a PCP or dentist. Pt states he likes to be in his room alone if he becomes angry. Pt was made a high fall risk because of his detox. Pt denies si/hi/ah/vh at this time and verbally agrees to approach staff if these become apparent or before harming himself/others if these become apparent.   Consents signed, skin/belongings search completed and patient oriented to unit. Patient stable at this time. Patient given the opportunity to express concerns and ask questions. Patient given toiletries. Will continue to monitor.

## 2018-06-07 NOTE — ED Notes (Signed)
Pt wanted to take a shower. Cooperative at this time.

## 2018-06-07 NOTE — ED Notes (Signed)
Lunch ordered 

## 2018-06-07 NOTE — Progress Notes (Signed)
Pt accepted to [facility], Bed 305-2, pending receipt of IVC Shuvon Rankin, NP is the accepting provider.  Nehemiah Massed, MD is the attending provider.  Call report to [phone]  [facility] ED notified.   Pt is IVC Pt may be transported by [employer] Pt scheduled  to arrive at [facility] as soon as IVC is served and sent to [facility].  Timmothy Euler. Kaylyn Lim, MSW, LCSWA Disposition Clinical Social Work [phone] (cell) [phone] (office)

## 2018-06-07 NOTE — ED Notes (Signed)
Belongings including 2 belongings bag with clothes and 1 security envelope with cash and change were given to [employer] to be transported to [facility]

## 2018-06-07 NOTE — ED Notes (Signed)
Breakfast Tray Ordered. 

## 2018-06-07 NOTE — ED Notes (Signed)
TTS bedside for evaluation 

## 2018-06-07 NOTE — Tx Team (Signed)
Initial Treatment Plan 06/07/2018 6:29 PM NASIR CAM [id number]    PATIENT STRESSORS: Financial difficulties Health problems Medication change or noncompliance Occupational concerns Substance abuse   PATIENT STRENGTHS: Ability for insight Active sense of humor Capable of independent living Communication skills Motivation for treatment/growth   PATIENT IDENTIFIED PROBLEMS: "stop these thoughts"  "stop drinking"  "get my ssi started back"                 DISCHARGE CRITERIA:  Ability to meet basic life and health needs Adequate post-discharge living arrangements Improved stabilization in mood, thinking, and/or behavior  PRELIMINARY DISCHARGE PLAN: Attend aftercare/continuing care group Outpatient therapy Placement in alternative living arrangements  PATIENT/FAMILY INVOLVEMENT: This treatment plan has been presented to and reviewed with the patient, Francisco Anderson.  The patient and family have been given the opportunity to ask questions and make suggestions.  Raylene Miyamoto, RN 06/07/2018, 6:29 PM

## 2018-06-07 NOTE — ED Notes (Signed)
[employer] contacted to transport pt to [facility]

## 2018-06-07 NOTE — ED Provider Notes (Signed)
Emergency Medicine Observation Re-evaluation Note  Francisco Anderson is a 53 y.o. male, seen on rounds today.  Pt initially presented to the ED for complaints of IVC and Suicidal Currently, the patient is sleeping. Discussed care with nurse, no needs at this time.  Physical Exam  BP 118/75 (BP Location: Right Arm)   Pulse 80   Temp 98.4 F (36.9 C) (Oral)   Resp 20   SpO2 94%  Physical Exam Sleeping, respirations even and unlabored. ED Course / MDM  [id number]   I have reviewed the labs performed to date as well as medications administered while in observation.  Recent changes in the last 24 hours include none. Plan  Current plan is for reassess in the morning. Patient is under full IVC at this time.   Jeannie Fend, PA-C 06/07/18 5638    Tegeler, Canary Brim, MD 06/07/18 0800

## 2018-06-07 NOTE — Consult Note (Signed)
Tele Assessment   Francisco Anderson, 53 y.o., male patient presented to [facility] with complaints of suicidal ideation and a plan to overdose, walk into traffic and to stab or shoot himself if he had a sharp knife or a gun; but does not have access.  Patient seen via telepsych by this provider; chart reviewed and consulted with Dr. Lucianne Muss on 06/07/18.  On evaluation Francisco Anderson reports " I came to the hospital because I was hearing voices and I want detox at behavioral health; and get back on my medicines."  Patient endorses suicidal ideation with no specific plan " I do anything I can.  If I had a gun I would use it.  But I can walk out into traffic."  Patient reports that he has no access to a gun.  Patient also states that he is hearing voices of a man and a woman telling him " kill yourself.  You're no good.  I feel like people are reading my thoughts."  Patient states that the auditory hallucinations started around December 2019 but over the last month the voices have worsened.  Patient denies visual hallucinations.  Patient reports that he has been psychotropic medications in the past he was taking Depakote and Seroquel but has not taken any medications since September 2019.  Reports he was going to [facility] for outpatient services and medication management but stopped going because " I just gave up and stop believing."  Patient endorses alcohol use disorder stating he drinks " as much as I can every day."  Patient denies the use of any other illicit drug use.  Patient's ETOH level is 79.  Urine drug screen has not been done.  Patient denies homicidal ideation, but endorses paranoia stating he feels that there are people out there that are trying to kill him.  Patient states that he is homeless and unemployed.  Reports that one time he was receiving SSI but after losing his wallet and ID he has not received a check.  States that he has heard of the [facility] but has not been there to get assistance.  Patient  reports that he does have family and friends in [place] but nobody will have anything to do with him.  Patient reports he is eating without any difficulty but he is not sleeping.  During evaluation Francisco Anderson is elevated up in bed; he is alert/oriented x 4; calm/cooperative; and mood congruent with affect.  Patient is speaking in a clear tone at moderate volume, and normal pace; with good eye contact.  His thought process is coherent and relevant; There is no indication that he is currently responding to internal/external stimuli or experiencing delusional thought content; although patient endorses that he is having auditory hallucinations with command and experiencing paranoia.  Patient denies homicidal ideation; but continues to endorse suicidal ideation with no specific plan, psychosis, and paranoia.  Patient has remained calm throughout assessment and has answered questions appropriately.  There is a possibility that patient may be malingering for secondary gain having a pace to sleep and food.  Patient has only had 2 ED visits in the last 6 months 1 related to suicidal ideation and of alcohol intoxication.    Recommendations: Inpatient psychiatric treatment Start Seroquel 25 mg Bid  Discontinue Ambien and Start Trazodone 50 mg Q hs for insomnia Continue Ativan detox protocol   Disposition: Recommend psychiatric Inpatient admission when medically cleared.   Spoke with Dr. Madilyn Hook; informed of above recommendation and disposition  Earleen Newport, NP

## 2018-06-08 DIAGNOSIS — Z590 Homelessness: Secondary | ICD-10-CM

## 2018-06-08 DIAGNOSIS — F333 Major depressive disorder, recurrent, severe with psychotic symptoms: Principal | ICD-10-CM

## 2018-06-08 DIAGNOSIS — F1020 Alcohol dependence, uncomplicated: Secondary | ICD-10-CM

## 2018-06-08 MED ORDER — CHLORDIAZEPOXIDE HCL 25 MG PO CAPS: 25.0000 mg | ORAL_CAPSULE | ORAL | Status: DC

## 2018-06-08 MED ORDER — CHLORDIAZEPOXIDE HCL 25 MG PO CAPS: 25.0000 mg | ORAL_CAPSULE | Freq: Every day | ORAL | Status: DC

## 2018-06-08 MED ORDER — THIAMINE HCL 100 MG/ML IJ SOLN: 100.0000 mg | Freq: Once | INTRAMUSCULAR | Status: DC

## 2018-06-08 MED ORDER — ONDANSETRON 4 MG PO TBDP: 4.0000 mg | ORAL_TABLET | Freq: Four times a day (QID) | ORAL | Status: DC | PRN

## 2018-06-08 MED ORDER — FLUOXETINE HCL 20 MG PO CAPS: 20.0000 mg | ORAL_CAPSULE | Freq: Every day | ORAL | 1 refills | Status: DC

## 2018-06-08 MED ORDER — LOPERAMIDE HCL 2 MG PO CAPS: 2.0000 mg | ORAL_CAPSULE | ORAL | Status: DC | PRN

## 2018-06-08 MED ORDER — NICOTINE 21 MG/24HR TD PT24
21.0000 mg | MEDICATED_PATCH | Freq: Every day | TRANSDERMAL | Status: DC
  Administered 2018-06-08: 21 mg via TRANSDERMAL
  Filled 2018-06-08 (×4): qty 1

## 2018-06-08 MED ORDER — ADULT MULTIVITAMIN W/MINERALS CH
1.0000 | ORAL_TABLET | Freq: Every day | ORAL | Status: DC
  Administered 2018-06-08: 1 via ORAL
  Filled 2018-06-08 (×3): qty 1

## 2018-06-08 MED ORDER — VITAMIN B-1 100 MG PO TABS
100.0000 mg | ORAL_TABLET | Freq: Every day | ORAL | Status: DC
  Filled 2018-06-08 (×2): qty 1

## 2018-06-08 MED ORDER — FLUOXETINE HCL 20 MG PO CAPS
20.0000 mg | ORAL_CAPSULE | Freq: Every day | ORAL | Status: DC
  Administered 2018-06-08: 20 mg via ORAL
  Filled 2018-06-08 (×4): qty 1

## 2018-06-08 MED ORDER — CHLORDIAZEPOXIDE HCL 25 MG PO CAPS: 25.0000 mg | ORAL_CAPSULE | Freq: Three times a day (TID) | ORAL | Status: DC

## 2018-06-08 MED ORDER — HYDROXYZINE HCL 25 MG PO TABS: 25.0000 mg | ORAL_TABLET | Freq: Four times a day (QID) | ORAL | Status: DC | PRN

## 2018-06-08 MED ORDER — CHLORDIAZEPOXIDE HCL 25 MG PO CAPS: 25.0000 mg | ORAL_CAPSULE | Freq: Four times a day (QID) | ORAL | Status: DC | PRN

## 2018-06-08 MED ORDER — CHLORDIAZEPOXIDE HCL 25 MG PO CAPS
25.0000 mg | ORAL_CAPSULE | Freq: Four times a day (QID) | ORAL | Status: DC
  Administered 2018-06-08: 25 mg via ORAL
  Filled 2018-06-08: qty 1

## 2018-06-08 MED ORDER — CHLORDIAZEPOXIDE HCL 25 MG PO CAPS
ORAL_CAPSULE | ORAL | Status: AC
  Filled 2018-06-08: qty 1

## 2018-06-08 NOTE — Progress Notes (Signed)
Provided patient with a long sleeve t shirt and pair of underwear from the coat closet.  Enid Cutter, LCSW-A Clinical Social Worker

## 2018-06-08 NOTE — Progress Notes (Signed)
D: Patient observed up and restless in the milieu. Asks this Clinical research associate at start of shift if he could take sleep meds and go on to bed. Although patient endorsed several suicidal plans PTA, he now states, "I feel safe now that I"m here." Patient's affect anxious, mood pleasant. Denies pain, physical complaints. No withdrawal noted or reported.     A: Medicated per orders, prn trazadone given for sleep. Medication education provided. Level III obs in place for safety. Emotional support offered. Patient encouraged to complete Suicide Safety Plan before discharge. Encouraged to attend and participate in unit programming.  R: Patient verbalizes understanding of POC. On reassess, patient resting in bed. Patient denies HI/AVH and remains safe on level III obs. Will continue to monitor throughout the night.

## 2018-06-08 NOTE — [facility] Suicide Risk Assessment (Signed)
[facility] Discharge Suicide Risk Assessment   Principal Problem: Alcoholism/homelessness/malingering/reports of depressive symptoms Discharge Diagnoses: Active Problems:   MDD (major depressive disorder), recurrent, severe, with psychosis (HCC)   Total Time spent with patient: 45 minutes  Patient is here discussing with his roommate how they both had plan to call EMS and obtain admission for housing purposes openly malingering given the jovial nature of this conversation lack of acute withdrawal and denial of suicidal thinking we will plan to discharge tomorrow  Mental Status Per Nursing Assessment::   On Admission:  Suicidal ideation indicated by patient, Suicide plan, Self-harm thoughts, Intention to act on suicide plan, Plan includes specific time, place, or method, Self-harm behaviors, Belief that plan would result in death  Demographic Factors:  Male  Loss Factors: Financial problems/change in socioeconomic status  Historical Factors: Impulsivity  Risk Reduction Factors:   NA  Continued Clinical Symptoms:  Alcohol/Substance Abuse/Dependencies  Cognitive Features That Contribute To Risk:  None    Suicide Risk:  Minimal: No identifiable suicidal ideation.  Patients presenting with no risk factors but with morbid ruminations; may be classified as minimal risk based on the severity of the depressive symptoms  Follow-up Information    [facility] Follow up.   Contact information: [address] [place] [address] [phone]           Plan Of Care/Follow-up recommendations:  Activity:  full  Maddisyn Hegwood, MD 06/08/2018, 12:44 PM

## 2018-06-08 NOTE — H&P (Signed)
Psychiatric Admission Assessment Adult  Patient Identification: Francisco Anderson MRN:  [id number] Date of Evaluation:  06/08/2018 Chief Complaint:  MDD recurrent severe with psychotic features  alcohol use disorder  Principal Diagnosis: Alcohol dependence and reports of depressive symptoms and thoughts of self-harm in the context of homelessness Diagnosis:  Active Problems:   MDD (major depressive disorder), recurrent, severe, with psychosis (HCC)  History of Present Illness:  This is a repeat admission, least the third psychiatric admission overall for Francisco Anderson who is 53 years of age, his chief complaint is stated as he is "homeless" and he begins laughing as if to indicate he is manipulating the system. He does however report drinking 2 cases of beer a day for couple of weeks his history is a little variable from examiner to examiner but he denies other drugs of abuse and his drug screen is negative.  His blood alcohol level 79. He states he has had recent thoughts of harming himself but does not have thoughts of harming himself now can contract for safety here. States he is depressed about being homeless.  according to our assessment team note of 3/4 Francisco Anderson is an 53 y.o. male.  The pt came in due to having suicidal thoughts.  The pt stated he would stab himself or shoot himself if he had a sharp knife or a gun.  The pt reported that he doesn't have access to a gun or knife.  The pt also stated he is having thoughts of overdosing on pills or walking in front of traffic.  He stated he is upset because he has been homeless for the past 8 months.  The pt stated he is drinking about 2 cases of beer a day.  Today he had about a case of beer and his blood alcohol level was 79.  The pt has attempted suicide in the past by cutting himself.  He stated he was last hospitalized in 2016.  He was last at [facility] in [facility].  He isn't currently seeing a counselor or psychiatrist.  The pt denies  self harm and HI.  He stated he has a court date in April of an open container.  The pt reports hearing voices telling him to take his life.  The pt stated he isn't sleeping well at night.  He denies any other SA and his UDS hasn't been completed at this time.  Pt is dressed in scrubs and he has a strong body odor. He is alert and oriented x4. Pt speaks in a clear tone, at moderate volume and normal pace. Eye contact is good. Pt's mood is depressed. Thought process is coherent and relevant. There is no indication Pt is currently responding to internal stimuli or experiencing delusional thought content.?Pt was cooperative throughout assessment.       Associated Signs/Symptoms: Depression Symptoms:  depressed mood, (Hypo) Manic Symptoms:  Denied Anxiety Symptoms:  Denied Psychotic Symptoms:  denied PTSD Symptoms: Negative Total Time spent with patient: 45 minutes   Is the patient at risk to self? Yes.    Has the patient been a risk to self in the past 6 months? No.  Has the patient been a risk to self within the distant past? Yes.    Is the patient a risk to others? No.  Has the patient been a risk to others in the past 6 months? No.  Has the patient been a risk to others within the distant past? No.   Prior Inpatient Therapy:  Prior Outpatient Therapy:    Alcohol Screening: 1. How often do you have a drink containing alcohol?: 4 or more times a week 2. How many drinks containing alcohol do you have on a typical day when you are drinking?: 10 or more 3. How often do you have six or more drinks on one occasion?: Daily or almost daily AUDIT-C Score: 12 4. How often during the last year have you found that you were not able to stop drinking once you had started?: Daily or almost daily 5. How often during the last year have you failed to do what was normally expected from you becasue of drinking?: Daily or almost daily 6. How often during the last year have you needed a first drink in the  morning to get yourself going after a heavy drinking session?: Daily or almost daily 7. How often during the last year have you had a feeling of guilt of remorse after drinking?: Daily or almost daily 8. How often during the last year have you been unable to remember what happened the night before because you had been drinking?: Daily or almost daily 9. Have you or someone else been injured as a result of your drinking?: Yes, during the last year 10. Has a relative or friend or a doctor or another health worker been concerned about your drinking or suggested you cut down?: Yes, during the last year Alcohol Use Disorder Identification Test Final Score (AUDIT): 40 Alcohol Brief Interventions/Follow-up: Alcohol Education Substance Abuse History in the last 12 months:  Yes.   Consequences of Substance Abuse: NA Previous Psychotropic Medications: Yes  Psychological Evaluations: No  Past Medical History:  Past Medical History:  Diagnosis Date  . Bipolar affective (HCC)   . Mood disorder (HCC) 08/21/2017  . Schizophrenia, acute [facility])     Past Surgical History:  Procedure Laterality Date  . FRACTURE SURGERY     Family History: History reviewed. No pertinent family history. Family Psychiatric  History: ukn to pt Tobacco Screening: Have you used any form of tobacco in the last 30 days? (Cigarettes, Smokeless Tobacco, Cigars, and/or Pipes): Yes Tobacco use, Select all that apply: 5 or more cigarettes per day Are you interested in Tobacco Cessation Medications?: Yes, will notify MD for an order Counseled patient on smoking cessation including recognizing danger situations, developing coping skills and basic information about quitting provided: Refused/Declined practical counseling Social History:  Social History   Substance and Sexual Activity  Alcohol Use Yes   Comment: 1-2 cases/day     Social History   Substance and Sexual Activity  Drug Use Not Currently  . Types: Cocaine   Comment:  yesterday crack cocaine 11-02-17    Additional Social History: Marital status: Single Are you sexually active?: No What is your sexual orientation?: Straight Has your sexual activity been affected by drugs, alcohol, medication, or emotional stress?: Denies Does patient have children?: Yes How many children?: 4 How is patient's relationship with their children?: One son is deceased. Three adult daughters- they have no relationship.                          Allergies:  No Known Allergies Lab Results:  Results for orders placed or performed during the hospital encounter of 06/06/18 (from the past 48 hour(s))  Comprehensive metabolic panel     Status: Abnormal   Collection Time: 06/06/18  1:54 PM  Result Value Ref Range   Sodium 137 135 - 145  mmol/L   Potassium 3.6 3.5 - 5.1 mmol/L   Chloride 102 98 - 111 mmol/L   CO2 25 22 - 32 mmol/L   Glucose, Bld 98 70 - 99 mg/dL   BUN 6 6 - 20 mg/dL   Creatinine, Ser 1.61 0.61 - 1.24 mg/dL   Calcium 8.7 (L) 8.9 - 10.3 mg/dL   Total Protein 6.8 6.5 - 8.1 g/dL   Albumin 3.5 3.5 - 5.0 g/dL   AST 096 (H) 15 - 41 U/L   ALT 92 (H) 0 - 44 U/L   Alkaline Phosphatase 90 38 - 126 U/L   Total Bilirubin 0.5 0.3 - 1.2 mg/dL   GFR calc non Af Amer >60 >60 mL/min   GFR calc Af Amer >60 >60 mL/min   Anion gap 10 5 - 15    Comment: Performed at [facility], [address] [place], [place] [address]  Ethanol     Status: Abnormal   Collection Time: 06/06/18  1:54 PM  Result Value Ref Range   Alcohol, Ethyl (B) 79 (H) <10 mg/dL    Comment: (NOTE) Lowest detectable limit for serum alcohol is 10 mg/dL. For medical purposes only. Performed at [facility], [address], [place], [place] [address]   Salicylate level     Status: None   Collection Time: 06/06/18  1:54 PM  Result Value Ref Range   Salicylate Lvl <7.0 2.8 - 30.0 mg/dL    Comment: Performed at [facility], [address], [place], [place] [address]  Acetaminophen  level     Status: None   Collection Time: 06/06/18  1:54 PM  Result Value Ref Range   Acetaminophen (Tylenol), Serum (NOTE) 10 - 30 ug/mL    Comment: Therapeutic concentrations vary significantly. A range of 10-30 ug/mL  may be an effective concentration for many patients. However, some  are best treated at concentrations outside of this range. Acetaminophen concentrations >150 ug/mL at 4 hours after ingestion  and >50 ug/mL at 12 hours after ingestion are often associated with  toxic reactions. Performed at [facility], [address], [place], [place] [address]   cbc     Status: Abnormal   Collection Time: 06/06/18  1:54 PM  Result Value Ref Range   WBC 5.8 4.0 - 10.5 K/uL   RBC 5.83 (H) 4.22 - 5.81 MIL/uL   Hemoglobin 16.1 13.0 - 17.0 g/dL   HCT 30.8 65.7 - 84.6 %   MCV 86.3 80.0 - 100.0 fL   MCH 27.6 26.0 - 34.0 pg   MCHC 32.0 30.0 - 36.0 g/dL   RDW 96.2 95.2 - 84.1 %   Platelets 133 (L) 150 - 400 K/uL    Comment: REPEATED TO VERIFY   nRBC 0.0 0.0 - 0.2 %    Comment: Performed at [facility], [address], [place], [place] [address]  Rapid urine drug screen (hospital performed)     Status: None   Collection Time: 06/06/18  4:40 PM  Result Value Ref Range   Opiates NONE DETECTED NONE DETECTED   Cocaine NONE DETECTED NONE DETECTED   Benzodiazepines NONE DETECTED NONE DETECTED   Amphetamines NONE DETECTED NONE DETECTED   Tetrahydrocannabinol NONE DETECTED NONE DETECTED   Barbiturates NONE DETECTED NONE DETECTED    Comment: (NOTE) DRUG SCREEN FOR MEDICAL PURPOSES ONLY.  IF CONFIRMATION IS NEEDED FOR ANY PURPOSE, NOTIFY LAB WITHIN 5 DAYS. LOWEST DETECTABLE LIMITS FOR URINE DRUG SCREEN Drug  Class                     Cutoff (ng/mL) Amphetamine and metabolites    1000 Barbiturate and metabolites    200 Benzodiazepine                 200 Tricyclics and metabolites     300 Opiates and metabolites        300 Cocaine and metabolites        300 THC                             50 Performed at [facility], [address], [place], [place] [address]     Blood Alcohol level:  Lab Results  Component Value Date   ETH 79 (H) 06/06/2018   ETH 127 (H) 01/22/2018    Metabolic Disorder Labs:  No results found for: HGBA1C, MPG No results found for: PROLACTIN No results found for: CHOL, TRIG, HDL, CHOLHDL, VLDL, LDLCALC  Current Medications: Current Facility-Administered Medications  Medication Dose Route Frequency Provider Last Rate Last Dose  . chlordiazePOXIDE (LIBRIUM) 25 MG capsule           . alum & mag hydroxide-simeth (MAALOX/MYLANTA) 200-200-20 MG/5ML suspension 30 mL  30 mL Oral Q4H PRN Donell Sievert E, PA-C      . chlordiazePOXIDE (LIBRIUM) capsule 25 mg  25 mg Oral Q6H PRN Malvin Johns, MD      . chlordiazePOXIDE (LIBRIUM) capsule 25 mg  25 mg Oral QID Malvin Johns, MD   25 mg at 06/08/18 1124   Followed by  . [START ON 06/09/2018] chlordiazePOXIDE (LIBRIUM) capsule 25 mg  25 mg Oral TID Malvin Johns, MD       Followed by  . [START ON 06/10/2018] chlordiazePOXIDE (LIBRIUM) capsule 25 mg  25 mg Oral Nicki Guadalajara, MD       Followed by  . [START ON 06/11/2018] chlordiazePOXIDE (LIBRIUM) capsule 25 mg  25 mg Oral Daily Malvin Johns, MD      . hydrOXYzine (ATARAX/VISTARIL) tablet 25 mg  25 mg Oral TID PRN Kerry Hough, PA-C   25 mg at 06/08/18 1914  . hydrOXYzine (ATARAX/VISTARIL) tablet 25 mg  25 mg Oral Q6H PRN Malvin Johns, MD      . loperamide (IMODIUM) capsule 2-4 mg  2-4 mg Oral PRN Malvin Johns, MD      . magnesium hydroxide (MILK OF MAGNESIA) suspension 30 mL  30 mL Oral Daily PRN Kerry Hough, PA-C      . multivitamin with minerals tablet 1 tablet  1 tablet Oral Daily Malvin Johns, MD      . nicotine (NICODERM CQ - dosed in mg/24 hours) patch 21 mg  21 mg Transdermal Daily Cobos, Rockey Situ, MD   21 mg at 06/08/18 7829  . ondansetron (ZOFRAN-ODT) disintegrating tablet 4 mg  4 mg Oral Q6H PRN Malvin Johns, MD       . thiamine (B-1) injection 100 mg  100 mg Intramuscular Once Malvin Johns, MD      . Melene Muller ON 06/09/2018] thiamine (VITAMIN B-1) tablet 100 mg  100 mg Oral Daily Malvin Johns, MD      . traZODone (DESYREL) tablet 50 mg  50 mg Oral QHS PRN Kerry Hough, PA-C   50 mg at 06/07/18 2107   PTA Medications: Medications Prior to Admission  Medication Sig Dispense Refill Last Dose  .  meclizine (ANTIVERT) 25 MG tablet Take 1 tablet (25 mg total) by mouth 3 (three) times daily as needed for dizziness. (Patient not taking: Reported on 01/22/2018) 30 tablet 0 Not Taking at Unknown time  . Menthol, Topical Analgesic, (ICY HOT NATURALS) 7.5 % CREA Apply 1 application topically 3 (three) times daily as needed. (Patient not taking: Reported on 01/22/2018) 70.8 g 0 Not Taking at Unknown time  . methocarbamol (ROBAXIN) 500 MG tablet Take 1 tablet (500 mg total) by mouth at bedtime as needed. (Patient not taking: Reported on 01/22/2018) 20 tablet 0 Not Taking at Unknown time  . naproxen (NAPROSYN) 500 MG tablet Take 1 tablet (500 mg total) by mouth 2 (two) times daily with a meal. (Patient not taking: Reported on 01/22/2018) 30 tablet 0 Not Taking at Unknown time  . nicotine (NICODERM CQ - DOSED IN MG/24 HOURS) 21 mg/24hr patch Place 1 patch (21 mg total) onto the skin daily. (Patient not taking: Reported on 01/22/2018) 28 patch 0 Not Taking at Unknown time  . predniSONE (DELTASONE) 10 MG tablet Take 6 tablets (60 mg total) by mouth daily. (Patient not taking: Reported on 01/22/2018) 30 tablet 0 Not Taking at Unknown time  . QUEtiapine (SEROQUEL) 50 MG tablet Take 3 tablets (150 mg total) by mouth at bedtime. (Patient not taking: Reported on 06/07/2018)   Not Taking at Unknown time    Musculoskeletal: Strength & Muscle Tone: within normal limits Gait & Station: normal Patient leans: N/A  Psychiatric Specialty Exam: Physical Exam  ROS  Blood pressure 106/68, pulse 63, temperature 98.1 F (36.7 C), temperature  source Oral, resp. rate 16, height 5\' 7"  (1.702 m), weight 78.9 kg, SpO2 95 %.Body mass index is 27.25 kg/m.  General Appearance: Casual and Disheveled  Eye Contact:  Fair  Speech:  Normal Rate  Volume:  Decreased  Mood:  Depressed is a mood state of complaint but appears euthymic  Affect:  Appropriate  Thought Process:  Linear  Orientation:  Full (Time, Place, and Person)  Thought Content:  Logical  Suicidal Thoughts:  Yes.  without intent/plan-on presentation denies current thoughts of self-harm  Homicidal Thoughts:  No  Memory:  Immediate;   Fair  Judgement:  Fair  Insight:  Fair  Psychomotor Activity:  Decreased  Concentration:  Concentration: Poor  Recall:  Poor  Fund of Knowledge:  Poor  Language:  Poor  Akathisia:  Negative  Handed:  Right  AIMS (if indicated):     Assets:  Physical Health Resilience  ADL's:  Intact  Cognition:  WNL  Sleep:  Number of Hours: 7    Treatment Plan Summary: Daily contact with patient to assess and evaluate symptoms and progress in treatment, Medication management and Plan Begin detox measures  Observation Level/Precautions:  15 minute checks  Laboratory:  UDS  Psychotherapy: Rehab based  Medications: Detox plus antidepressant  Consultations: None necessary at present  Discharge Concerns: Housing issues long-term sobriety  3 to 5-day length of stay  Other:   This 1 alcohol dependence and abuse/depression recurrent severe without psychosis/issues of secondary gain rule out malingering   Physician Treatment Plan for Primary Diagnosis: <principal problem not specified> Long Term Goal(s): Improvement in symptoms so as ready for discharge  Short Term Goals: Ability to demonstrate self-control will improve and Ability to identify and develop effective coping behaviors will improve  Physician Treatment Plan for Secondary Diagnosis: Active Problems:   MDD (major depressive disorder), recurrent, severe, with psychosis (HCC)  Long Term  Goal(s):  Improvement in symptoms so as ready for discharge  Short Term Goals: Compliance with prescribed medications will improve  I certify that inpatient services furnished can reasonably be expected to improve the patient's condition.    Malvin Johns, MD 3/6/202012:27 PM

## 2018-06-08 NOTE — Progress Notes (Signed)
Pt OOB UAL on the 300 hall today. HE is anxious, endorses a flat, disinterested affect. HE completes his daily assessment and on this he wrote he has experienced SI today and he verbally contracts with this writer to not act on them..he says " its always there". When his roommate is dc'd , he became quite agitated and said " well youd better send me too". Writer spoke with physician and he decided pt could be dc'd today. Writer completed [employer] with pt, pt states he will not act on SI .Marland Kitchenhe is given cc of dc instrucitons (SRA AVS and transition record) and he refused to complet suicide safety plan. ALl belonginns are returned to him and he is escorted out of the building.

## 2018-06-08 NOTE — [facility] Suicide Risk Assessment (Signed)
[facility] INPATIENT:  Family/Significant Other Suicide Prevention Education  Suicide Prevention Education:  Education Completed; brother, Garth Schlatter [phone]) has been identified by the patient as the family member/significant other with whom the patient will be residing, and identified as the person(s) who will aid the patient in the event of a mental health crisis (suicidal ideations/suicide attempt).  With written consent from the patient, the family member/significant other has been provided the following suicide prevention education, prior to the and/or following the discharge of the patient.  The suicide prevention education provided includes the following:  Suicide risk factors  Suicide prevention and interventions  National Suicide Hotline telephone number  [facility] assessment telephone number  [facility] Emergency Assistance 911  [facility] and/or Residential Mobile Crisis Unit telephone number  Request made of family/significant other to:  Remove weapons (e.g., guns, rifles, knives), all items previously/currently identified as safety concern.    Remove drugs/medications (over-the-counter, prescriptions, illicit drugs), all items previously/currently identified as a safety concern.  The family member/significant other verbalizes understanding of the suicide prevention education information provided.  The family member/significant other agrees to remove the items of safety concern listed above.   Brother is patient's payee. Brother reports he hasn't spoken to patient in months. He didn't have safety concerns, but admittedly he does not talk to his brother or plan to interact with him in the near future.   CSW will review SPE with patient.  Darreld Mclean 06/08/2018, 10:29 AM

## 2018-06-08 NOTE — [facility] Suicide Risk Assessment (Signed)
[facility] Admission Suicide Risk Assessment   Nursing information obtained from:  Patient Demographic factors:  Male, Unemployed, Divorced or widowed, Low socioeconomic status, Caucasian, Living alone Current Mental Status:  Suicidal ideation indicated by patient, Suicide plan, Self-harm thoughts, Intention to act on suicide plan, Plan includes specific time, place, or method, Self-harm behaviors, Belief that plan would result in death Loss Factors:  Decrease in vocational status, Decline in physical health, Financial problems / change in socioeconomic status Historical Factors:  Prior suicide attempts, Impulsivity Risk Reduction Factors:  Positive social support, Positive coping skills or problem solving skills  Total Time spent with patient: 45 minutes Principal Problem: Alcoholism/homelessness reports of depressive symptoms Diagnosis:  Active Problems:   MDD (major depressive disorder), recurrent, severe, with psychosis (HCC)  Subjective Data: Patient admitted due to depressive symptoms by report and alcoholism  Continued Clinical Symptoms:  Alcohol Use Disorder Identification Test Final Score (AUDIT): 40 The "Alcohol Use Disorders Identification Test", Guidelines for Use in Primary Care, Second Edition.  World Science writer [facility]). Score between 0-7:  no or low risk or alcohol related problems. Score between 8-15:  moderate risk of alcohol related problems. Score between 16-19:  high risk of alcohol related problems. Score 20 or above:  warrants further diagnostic evaluation for alcohol dependence and treatment.   CLINICAL FACTORS:   Alcohol/Substance Abuse/Dependencies   COGNITIVE FEATURES THAT CONTRIBUTE TO RISK:  None    SUICIDE RISK:   Minimal: No identifiable suicidal ideation.  Patients presenting with no risk factors but with morbid ruminations; may be classified as minimal risk based on the severity of the depressive symptoms  PLAN OF CARE: Detox underway see admission  orders  I certify that inpatient services furnished can reasonably be expected to improve the patient's condition.   Malvin Johns, MD 06/08/2018, 12:24 PM

## 2018-06-08 NOTE — Progress Notes (Signed)
Patient reports he gets about $800 a month in SSI. He gets the money direct deposited on a [employer] debit card. Patient lost his debit card in November. His brother is his payee, but they do not talk. CSW called brother with written permission, brother explains that patient needs to call or go online and order a new card. Brother would not be able to bring patient a new debit card, as the brother works out of town often.  CSW provided patient with phone number for debit card issuer, phone number for social security administration, and the mailing address for the [facility].   CSW also explained that the [facility] can assist patient with obtaining a new photo ID.  Enid Cutter, LCSW-A Clinical Social Worker

## 2018-06-08 NOTE — BHH Counselor (Signed)
Adult Comprehensive Assessment  Patient ID: Francisco Anderson, male   DOB: [birth year], 53 y.o.   MRN: [id number]  Information Source: Information source: Patient  Current Stressors:  Patient states their primary concerns and needs for treatment are:: "Get some rest" Patient states their goals for this hospitilization and ongoing recovery are:: "I don't have any" Educational / Learning stressors: Denies Employment / Job issues: Disabled. Family Relationships: Brother is payee, strained relationship and they don't talk. Brother lives in [place].  Financial / Lack of resources (include bankruptcy): Used to get SSDI. Lost wallet back in November, SSDI check was being direct deposited onto card. Housing / Lack of housing: Homeless since May 2019.  Physical health (include injuries & life threatening diseases): Has difficulty sleeping in the last 3 months. Social relationships: Denies supports. Substance abuse: Hx of drug use, none currently. Drinks daily, beer, "as much as I can drink." Bereavement / Loss: "I don't get along with my family, I don't know what's going on."  Living/Environment/Situation:  Living Arrangements: Alone Living conditions (as described by patient or guardian): Homeless, sleeps everywhere Who else lives in the home?: Self How long has patient lived in current situation?: Since May 2018 What is atmosphere in current home: Dangerous, Chaotic, Temporary  Family History:  Marital status: Single Are you sexually active?: No What is your sexual orientation?: Straight Has your sexual activity been affected by drugs, alcohol, medication, or emotional stress?: Denies Does patient have children?: Yes How many children?: 4 How is patient's relationship with their children?: One son is deceased. Three adult daughters- they have no relationship.   Childhood History:  By whom was/is the patient raised?: Mother Additional childhood history information: Grew up in  [place] Description of patient's relationship with caregiver when they were a child: Good with mom. Didn't know dad.  Patient's description of current relationship with people who raised him/her: Mom is deceased. How were you disciplined when you got in trouble as a child/adolescent?: Appropriate Does patient have siblings?: Yes Number of Siblings: 6 Description of patient's current relationship with siblings: Doesn't talk to them  Did patient suffer any verbal/emotional/physical/sexual abuse as a child?: No Did patient suffer from severe childhood neglect?: No Has patient ever been sexually abused/assaulted/raped as an adolescent or adult?: No Was the patient ever a victim of a crime or a disaster?: No Witnessed domestic violence?: No Has patient been effected by domestic violence as an adult?: No  Education:  Highest grade of school patient has completed: 7th grade Currently a student?: No Learning disability?: No  Employment/Work Situation:   Employment situation: On disability Why is patient on disability: "I have a mental disorder." Bipolar, paranoid schizophrenic.  How long has patient been on disability: Since 2005 Patient's job has been impacted by current illness: No What is the longest time patient has a held a job?: Doesn't know Where was the patient employed at that time?: unsure Did You Receive Any Psychiatric Treatment/Services While in the [employer]?: No Are There Guns or Other Weapons in Your Home?: No  Financial Resources:   Financial resources: Insurance claims handler Does patient have a Lawyer or guardian?: Yes Name of representative payee or guardian: Brother, Francisco Anderson  Alcohol/Substance Abuse:   What has been your use of drugs/alcohol within the last 12 months?: Daily ETOH Alcohol/Substance Abuse Treatment Hx: Past detox, Past Tx, Outpatient If yes, describe treatment: Detox in [place]. [facility] for outpatient in the past.  Has  alcohol/substance abuse ever caused legal problems?: Yes("I've gotten  plenty of open container charges. I have a court date in April.")  Social Support System:   Forensic psychologist System: None Museum/gallery exhibitions officer System: No one Type of faith/religion: "I believe in [place]." How does patient's faith help to cope with current illness?: No it doesn't help  Leisure/Recreation:   Leisure and Hobbies: None  Strengths/Needs:   What is the patient's perception of their strengths?: "Nothing." Patient states they can use these personal strengths during their treatment to contribute to their recovery: Not sure Patient states these barriers may affect/interfere with their treatment: Homelessness Patient states these barriers may affect their return to the community: Homelessness  Discharge Plan:   Currently receiving community mental health services: No Patient states concerns and preferences for aftercare planning are: Wants an [facility] referral. [facility] referral.  Patient states they will know when they are safe and ready for discharge when: "No answer for that one. When I get plenty of rest." Does patient have access to transportation?: Yes("I walk everywhere.") Does patient have financial barriers related to discharge medications?: Yes Patient description of barriers related to discharge medications: Doesn't have his bankcard Plan for living situation after discharge: (Unsure, will provide shelter resources) Will patient be returning to same living situation after discharge?: No  Summary/Recommendations:   Summary and Recommendations (to be completed by the evaluator): Francisco Anderson is a 53 year old male who resides in [place]. He presents voluntarily to [facility] from [facility] with complaints of SI with various plans. Patient primary stressors include being homeless, losing his wallet with his debit card that his disability check is direct deposited on, and having poor sleep. Patient  reports his brother is his payee, but he hasn't spoken with his brother about getting his card issued, patient reportedly gets $800 in SSI. Patient has a court date in April for an open container charge. Patient requesting residential treatment. Patient does not have outpatient providers and reports a hx of bipolar disorder. Patient will benefit from crisis stabilization, medication management, therapeutic milieu, and referrals for service.  Francisco Anderson. 06/08/2018

## 2018-06-08 NOTE — BHH Suicide Risk Assessment (Signed)
[facility] Discharge Suicide Risk Assessment   Principal Problem: alcohol abuse/malingering Discharge Diagnoses: Active Problems:   MDD (major depressive disorder), recurrent, severe, with psychosis (HCC)   Total Time spent with patient: 45 minutes  Patient states that since his roommate has demanded discharge he insists on going home to he denies suicidal thoughts cravings tremors or withdrawal symptoms denies psychotic symptoms Mental Status Per Nursing Assessment::   On Admission:  Suicidal ideation indicated by patient, Suicide plan, Self-harm thoughts, Intention to act on suicide plan, Plan includes specific time, place, or method, Self-harm behaviors, Belief that plan would result in death  Demographic Factors:  Male-  Loss Factors: Decrease in vocational status  Historical Factors: Impulsivity  Risk Reduction Factors:   Religious beliefs about death  Continued Clinical Symptoms:  Alcohol/Substance Abuse/Dependencies  Cognitive Features That Contribute To Risk:  None    Suicide Risk:  Minimal: No identifiable suicidal ideation.  Patients presenting with no risk factors but with morbid ruminations; may be classified as minimal risk based on the severity of the depressive symptoms  Follow-up Information    [facility] Follow up.   Contact information: [address] [place] [address] [phone]           Plan Of Care/Follow-up recommendations:  Activity:  full  Guerline Happ, MD 06/08/2018, 3:10 PM

## 2018-06-08 NOTE — Tx Team (Signed)
Interdisciplinary Treatment and Diagnostic Plan Update  06/08/2018 Time of Session: 9:30am [name] MRN: [id number]  Principal Diagnosis: <principal problem not specified>  Secondary Diagnoses: Active Problems:   MDD (major depressive disorder), recurrent, severe, with psychosis ([place])   Current Medications:  Current Facility-Administered Medications  Medication Dose Route Frequency Provider Last Rate Last Dose  . alum & mag hydroxide-simeth (MAALOX/MYLANTA) 200-200-20 MG/5ML suspension 30 mL  30 mL Oral Q4H PRN Patriciaann Clan E, PA-C      . hydrOXYzine (ATARAX/VISTARIL) tablet 25 mg  25 mg Oral TID PRN Patriciaann Clan E, PA-C   25 mg at 06/08/18 0837  . magnesium hydroxide (MILK OF MAGNESIA) suspension 30 mL  30 mL Oral Daily PRN Patriciaann Clan E, PA-C      . nicotine (NICODERM CQ - dosed in mg/24 hours) patch 21 mg  21 mg Transdermal Daily Cobos, Myer Peer, MD   21 mg at 06/08/18 6553  . traZODone (DESYREL) tablet 50 mg  50 mg Oral QHS PRN Laverle Hobby, PA-C   50 mg at 06/07/18 2107   PTA Medications: Medications Prior to Admission  Medication Sig Dispense Refill Last Dose  . meclizine (ANTIVERT) 25 MG tablet Take 1 tablet (25 mg total) by mouth 3 (three) times daily as needed for dizziness. (Patient not taking: Reported on 01/22/2018) 30 tablet 0 Not Taking at Unknown time  . Menthol, Topical Analgesic, (ICY HOT NATURALS) 7.5 % CREA Apply 1 application topically 3 (three) times daily as needed. (Patient not taking: Reported on 01/22/2018) 70.8 g 0 Not Taking at Unknown time  . methocarbamol (ROBAXIN) 500 MG tablet Take 1 tablet (500 mg total) by mouth at bedtime as needed. (Patient not taking: Reported on 01/22/2018) 20 tablet 0 Not Taking at Unknown time  . naproxen (NAPROSYN) 500 MG tablet Take 1 tablet (500 mg total) by mouth 2 (two) times daily with a meal. (Patient not taking: Reported on 01/22/2018) 30 tablet 0 Not Taking at Unknown time  . nicotine (NICODERM CQ - DOSED  IN MG/24 HOURS) 21 mg/24hr patch Place 1 patch (21 mg total) onto the skin daily. (Patient not taking: Reported on 01/22/2018) 28 patch 0 Not Taking at Unknown time  . predniSONE (DELTASONE) 10 MG tablet Take 6 tablets (60 mg total) by mouth daily. (Patient not taking: Reported on 01/22/2018) 30 tablet 0 Not Taking at Unknown time  . QUEtiapine (SEROQUEL) 50 MG tablet Take 3 tablets (150 mg total) by mouth at bedtime. (Patient not taking: Reported on 06/07/2018)   Not Taking at Unknown time    Patient Stressors: Financial difficulties Health problems Medication change or noncompliance Occupational concerns Substance abuse  Patient Strengths: Ability for insight Active sense of humor Capable of independent living Communication skills Motivation for treatment/growth  Treatment Modalities: Medication Management, Group therapy, Case management,  1 to 1 session with clinician, Psychoeducation, Recreational therapy.   Physician Treatment Plan for Primary Diagnosis: <principal problem not specified> Long Term Goal(s):     Short Term Goals:    Medication Management: Evaluate patient's response, side effects, and tolerance of medication regimen.  Therapeutic Interventions: 1 to 1 sessions, Unit Group sessions and Medication administration.  Evaluation of Outcomes: Not Met  Physician Treatment Plan for Secondary Diagnosis: Active Problems:   MDD (major depressive disorder), recurrent, severe, with psychosis ([place])  Long Term Goal(s):     Short Term Goals:       Medication Management: Evaluate patient's response, side effects, and tolerance of medication regimen.  Therapeutic Interventions: 1 to 1 sessions, Unit Group sessions and Medication administration.  Evaluation of Outcomes: Not Met   RN Treatment Plan for Primary Diagnosis: <principal problem not specified> Long Term Goal(s): Knowledge of disease and therapeutic regimen to maintain health will improve  Short Term Goals:  Ability to participate in decision making will improve and Ability to identify and develop effective coping behaviors will improve  Medication Management: RN will administer medications as ordered by provider, will assess and evaluate patient's response and provide education to patient for prescribed medication. RN will report any adverse and/or side effects to prescribing provider.  Therapeutic Interventions: 1 on 1 counseling sessions, Psychoeducation, Medication administration, Evaluate responses to treatment, Monitor vital signs and CBGs as ordered, Perform/monitor CIWA, COWS, AIMS and Fall Risk screenings as ordered, Perform wound care treatments as ordered.  Evaluation of Outcomes: Not Met   LCSW Treatment Plan for Primary Diagnosis: <principal problem not specified> Long Term Goal(s): Safe transition to appropriate next level of care at discharge, Engage patient in therapeutic group addressing interpersonal concerns.  Short Term Goals: Engage patient in aftercare planning with referrals and resources and Increase social support  Therapeutic Interventions: Assess for all discharge needs, 1 to 1 time with Social worker, Explore available resources and support systems, Assess for adequacy in community support network, Educate family and significant other(s) on suicide prevention, Complete Psychosocial Assessment, Interpersonal group therapy.  Evaluation of Outcomes: Not Met   Progress in Treatment: Attending groups: No. Participating in groups: No. Taking medication as prescribed: Yes. Toleration medication: Yes. Family/Significant other contact made: No, will contact:  brother Patient understands diagnosis: Yes. Discussing patient identified problems/goals with staff: Yes. Medical problems stabilized or resolved: Yes. Denies suicidal/homicidal ideation: Yes. Issues/concerns per patient self-inventory: Yes.  New problem(s) identified: Yes, Describe:  homeless. Gets SSI but lost his  debit card. Brother is payee.  New Short Term/Long Term Goal(s): detox, medication management for mood stabilization; elimination of SI thoughts; development of comprehensive mental wellness/sobriety plan.  Patient Goals: "Get some rest. Get my SSI back and my medicaid turned on."  Discharge Plan or Barriers: Wants referral for [facility]. Continuing to assess for other appropriate referrals. [place] pamphlet, Mobile Crisis information, and AA/NA information provided to patient for additional community support and resources.   Reason for Continuation of Hospitalization: Anxiety Depression Suicidal ideation Withdrawal symptoms  Estimated Length of Stay: 3 days  Attendees: Patient: Francisco Anderson 06/08/2018 9:50 AM  Physician:  06/08/2018 9:50 AM  Nursing:  06/08/2018 9:50 AM  RN Care Manager: 06/08/2018 9:50 AM  Social Worker: Stephanie Acre, [place] 06/08/2018 9:50 AM  Recreational Therapist:  06/08/2018 9:50 AM  Other:  06/08/2018 9:50 AM  Other:  06/08/2018 9:50 AM  Other: 06/08/2018 9:50 AM    Scribe for Treatment Team: Joellen Jersey, [place] 06/08/2018 9:50 AM

## 2018-06-08 NOTE — Progress Notes (Signed)
  [facility] Adult Case Management Discharge Plan :  Will you be returning to the same living situation after discharge:  Yes,  shelter At discharge, do you have transportation home?: Yes,  bus pass Do you have the ability to pay for your medications: Yes,  Medicaid  Release of information consent forms completed and in the chart. Patient to Follow up at: Follow-up Information    [facility] Follow up on 06/12/2018.   Why:  Your hospital follow up appointment is Tuesday, 3/10 at 8:00a.  Please bring your photo ID, proof of insurance, SSN, current medications and discharge paperwork from this hospitalization.  Contact information: [address] [place] [address] [phone]           Next level of care provider has access to [facility] Link:no  Safety Planning and Suicide Prevention discussed: Yes,  with patient  Have you used any form of tobacco in the last 30 days? (Cigarettes, Smokeless Tobacco, Cigars, and/or Pipes): Yes  Has patient been referred to the Quitline?: Patient refused referral  Patient has been referred for addiction treatment: Yes  Darreld Mclean, LCSWA 06/08/2018, 3:47 PM

## 2018-06-08 NOTE — BHH Group Notes (Signed)
LCSW Group Therapy Note  06/08/2018 3:44 PM  Type of Therapy and Topic: Group Therapy: Avoiding Self-Sabotaging and Enabling Behaviors  Participation Level: Minimal  Description of Group:  In this group, patients will learn how to identify obstacles, self-sabotaging and enabling behaviors, as well as: what are they, why do we do them and what needs these behaviors meet. Discuss unhealthy relationships and how to have positive healthy boundaries with those that sabotage and enable. Explore aspects of self-sabotage and enabling in yourself and how to limit these self-destructive behaviors in everyday life.  Therapeutic Goals: 1. Patient will identify one obstacle that relates to self-sabotage and enabling behaviors 2. Patient will identify one personal self-sabotaging or enabling behavior they did prior to admission 3. Patient will state a plan to change the above identified behavior 4. Patient will demonstrate ability to communicate their needs through discussion and/or role play.   Summary of Patient Progress: Patient listened throughout most of group. He shared that he has negative thoughts.   Therapeutic Modalities:  Cognitive Behavioral Therapy Person-Centered Therapy Motivational Interviewing  Enid Cutter, MSW, [employer] Clinical Social Worker

## 2018-06-08 NOTE — Discharge Summary (Signed)
Physician Discharge Summary Note  Patient:  Francisco Anderson is an 53 y.o., male MRN:  [id number] DOB:  17-May-1965 Patient phone:  [phone] (home)  Patient address:   [place] [address],  Total Time spent with patient: 15 minutes  Date of Admission:  06/07/2018 Date of Discharge: 06/08/2018  Reason for Admission: homelessness, reported suicidal ideation  Principal Problem: <principal problem not specified> Discharge Diagnoses: Active Problems:   MDD (major depressive disorder), recurrent, severe, with psychosis (HCC)   Past Psychiatric History: Previous admissions for similar presentation.  Past Medical History:  Past Medical History:  Diagnosis Date  . Bipolar affective (HCC)   . Mood disorder (HCC) 08/21/2017  . Schizophrenia, acute [facility])     Past Surgical History:  Procedure Laterality Date  . FRACTURE SURGERY     Family History: History reviewed. No pertinent family history. Family Psychiatric  History: Patient denies. Social History:  Social History   Substance and Sexual Activity  Alcohol Use Yes   Comment: 1-2 cases/day     Social History   Substance and Sexual Activity  Drug Use Not Currently  . Types: Cocaine   Comment: yesterday crack cocaine 11-02-17    Social History   Socioeconomic History  . Marital status: Single    Spouse name: Not on file  . Number of children: Not on file  . Years of education: Not on file  . Highest education level: Not on file  Occupational History  . Not on file  Social Needs  . Financial resource strain: Not on file  . Food insecurity:    Worry: Not on file    Inability: Not on file  . Transportation needs:    Medical: Not on file    Non-medical: Not on file  Tobacco Use  . Smoking status: Current Every Day Smoker    Packs/day: 2.00  . Smokeless tobacco: Never Used  Substance and Sexual Activity  . Alcohol use: Yes    Comment: 1-2 cases/day  . Drug use: Not Currently    Types: Cocaine   Comment: yesterday crack cocaine 11-02-17  . Sexual activity: Not Currently  Lifestyle  . Physical activity:    Days per week: Not on file    Minutes per session: Not on file  . Stress: Not on file  Relationships  . Social connections:    Talks on phone: Not on file    Gets together: Not on file    Attends religious service: Not on file    Active member of club or organization: Not on file    Attends meetings of clubs or organizations: Not on file    Relationship status: Not on file  Other Topics Concern  . Not on file  Social History Narrative  . Not on file    Hospital Course:  Per MD's admission H&P 06/08/2018: This is a repeat admission, least the third psychiatric admission overall for Francisco Anderson who is 53 years of age, his chief complaint is stated as he is "homeless" and he begins laughing as if to indicate he is manipulating the system. He does however report drinking 2 cases of beer a day for couple of weeks his history is a little variable from examiner to examiner but he denies other drugs of abuse and his drug screen is negative.  His blood alcohol level 79. He states he has had recent thoughts of harming himself but does not have thoughts of harming himself now can contract for safety here. States he  is depressed about being homeless. According to our assessment team note of 3/4 Francisco Anderson an 52 y.o.male.The pt came in due to having suicidal thoughts. The pt stated he would stab himself or shoot himself if he had a sharp knife or a gun. The pt reported that he doesn't have access to a gun or knife. The pt also stated he is having thoughts of overdosing on pills or walking in front of traffic. He stated he is upset because he has been homeless for the past 8 months. The pt stated he is drinking about 2 cases of beer a day. Today he had about a case of beer and his blood alcohol level was 79. The pt has attempted suicide in the past by cutting himself. He stated he  was last hospitalized in 2016. He was last at [facility] in 2007. He isn't currently seeing a counselor or psychiatrist. The pt denies self harm and HI. He stated he has a court date in April of an open container. The pt reports hearing voices telling him to take his life. The pt stated he isn't sleeping well at night. He denies any other SA and his UDS hasn't been completed at this time. Pt is dressed in scrubsand he has a strong body odor.He is alert and oriented x4. Pt speaks in a clear tone, at moderate volume and normal pace. Eye contact is good. Pt's mood is depressed. Thought process is coherent and relevant. There is no indication Pt is currently responding to internal stimuli or experiencing delusional thought content.?Pt was cooperative throughout assessment.  Francisco Anderson was admitted for suicidal ideation in the context of alcohol abuse. On admission to the unit he denied SI/HI/AVH. His friend was also a patient on the unit, and when he found out this other patient was discharging, he demanded discharge as well. He and roommate were heard by staff laughing and discussing having called EMS to obtain admission for housing purposes only. No withdrawal symptoms. CIWA 2. Patient denies SI/HI/AVH and contracts for safety. He remained on the [facility] unit for 2 days. He was discharged on the medications listed below. He agrees to follow up at [facility] (see below). Patient is provided with prescriptions for medications upon discharge. He is discharging with bus pass to shelter.  Physical Findings: AIMS: Facial and Oral Movements Muscles of Facial Expression: None, normal Lips and Perioral Area: None, normal Jaw: None, normal Tongue: None, normal,Extremity Movements Upper (arms, wrists, hands, fingers): None, normal Lower (legs, knees, ankles, toes): None, normal, Trunk Movements Neck, shoulders, hips: None, normal, Overall Severity Severity of abnormal movements (highest score from questions above):  None, normal Incapacitation due to abnormal movements: None, normal Patient's awareness of abnormal movements (rate only patient's report): No Awareness, Dental Status Current problems with teeth and/or dentures?: No Does patient usually wear dentures?: Yes  CIWA:  CIWA-Ar Total: 2 COWS:     Musculoskeletal: Strength & Muscle Tone: within normal limits Gait & Station: normal Patient leans: N/A  Psychiatric Specialty Exam: Physical Exam  Nursing note and vitals reviewed. Constitutional: He is oriented to person, place, and time. He appears well-developed and well-nourished.  Cardiovascular: Normal rate.  Respiratory: Effort normal.  Neurological: He is alert and oriented to person, place, and time.    Review of Systems  Constitutional: Negative.   Respiratory: Negative.   Cardiovascular: Negative.   Psychiatric/Behavioral: Positive for substance abuse (hx ETOH). Negative for depression, hallucinations, memory loss and suicidal ideas. The patient is not nervous/anxious  and does not have insomnia.     Blood pressure 106/68, pulse 63, temperature 97.9 F (36.6 C), temperature source Oral, resp. rate 16, height 5\' 7"  (1.702 m), weight 78.9 kg, SpO2 95 %.Body mass index is 27.25 kg/m.  General Appearance: Casual  Eye Contact:  Fair  Speech:  Normal Rate  Volume:  Normal  Mood:  Euthymic  Affect:  Congruent  Thought Process:  Coherent  Orientation:  Full (Time, Place, and Person)  Thought Content:  WDL  Suicidal Thoughts:  No  Homicidal Thoughts:  No  Memory:  Immediate;   Good  Judgement:  Fair  Insight:  Fair  Psychomotor Activity:  Normal  Concentration:  Concentration: Fair  Recall:  Fair  Fund of Knowledge:  Fair  Language:  Fair  Akathisia:  No  Handed:  Right  AIMS (if indicated):     Assets:  Communication Skills Leisure Time Physical Health  ADL's:  Intact  Cognition:  WNL  Sleep:  Number of Hours: 7     Have you used any form of tobacco in the last 30  days? (Cigarettes, Smokeless Tobacco, Cigars, and/or Pipes): Yes  Has this patient used any form of tobacco in the last 30 days? (Cigarettes, Smokeless Tobacco, Cigars, and/or Pipes) Yes, a prescription for an FDA-approved medication for tobacco cessation was offered at discharge.   Blood Alcohol level:  Lab Results  Component Value Date   ETH 79 (H) 06/06/2018   ETH 127 (H) 01/22/2018    Metabolic Disorder Labs:  No results found for: HGBA1C, MPG No results found for: PROLACTIN No results found for: CHOL, TRIG, HDL, CHOLHDL, VLDL, LDLCALC  See Psychiatric Specialty Exam and Suicide Risk Assessment completed by Attending Physician prior to discharge.  Discharge destination:  Home  Is patient on multiple antipsychotic therapies at discharge:  No   Has Patient had three or more failed trials of antipsychotic monotherapy by history:  No  Recommended Plan for Multiple Antipsychotic Therapies: NA   Allergies as of 06/08/2018   No Known Allergies     Medication List    STOP taking these medications   meclizine 25 MG tablet Commonly known as:  ANTIVERT   Menthol (Topical Analgesic) 7.5 % Crea Commonly known as:  Icy Hot Naturals   methocarbamol 500 MG tablet Commonly known as:  ROBAXIN   naproxen 500 MG tablet Commonly known as:  NAPROSYN   nicotine 21 mg/24hr patch Commonly known as:  NICODERM CQ - dosed in mg/24 hours   predniSONE 10 MG tablet Commonly known as:  DELTASONE   QUEtiapine 50 MG tablet Commonly known as:  SEROQUEL     TAKE these medications     Indication  FLUoxetine 20 MG capsule Commonly known as:  PROZAC Take 1 capsule (20 mg total) by mouth daily.  Indication:  Depression      Follow-up Information    [facility] Follow up on 06/12/2018.   Why:  Your hospital follow up appointment is Tuesday, 3/10 at 8:00a.  Please bring your photo ID, proof of insurance, SSN, current medications and discharge paperwork from this hospitalization.  Contact  information: [address] [place] [address] [phone]           Follow-up recommendations: Activity as tolerated. Diet as recommended by primary care physician. Keep all scheduled follow-up appointments as recommended.   Comments:   Patient is instructed to take all prescribed medications as recommended. Report any side effects or adverse reactions to your outpatient psychiatrist.  Patient is instructed to abstain from alcohol and illegal drugs while on prescription medications. In the event of worsening symptoms, patient is instructed to call the crisis hotline, 911, or go to the nearest emergency department for evaluation and treatment.  Signed: Aldean BakerJanet E Jonea Bukowski, NP 06/08/2018, 3:50 PM

## 2018-08-28 ENCOUNTER — Emergency Department (HOSPITAL_COMMUNITY): Payer: Medicaid Other

## 2018-08-28 ENCOUNTER — Emergency Department (HOSPITAL_COMMUNITY)
Admission: EM | Admit: 2018-08-28 | Discharge: 2018-08-29 | Disposition: A | Discharge status: Home / Self Care | Payer: Medicaid Other | Attending: Emergency Medicine | Admitting: Emergency Medicine

## 2018-08-28 ENCOUNTER — Encounter (HOSPITAL_COMMUNITY): Payer: Self-pay

## 2018-08-28 ENCOUNTER — Other Ambulatory Visit: Payer: Self-pay

## 2018-08-28 DIAGNOSIS — S299XXA Unspecified injury of thorax, initial encounter: Secondary | ICD-10-CM | POA: Diagnosis present

## 2018-08-28 DIAGNOSIS — S0001XA Abrasion of scalp, initial encounter: Secondary | ICD-10-CM | POA: Insufficient documentation

## 2018-08-28 DIAGNOSIS — S20219A Contusion of unspecified front wall of thorax, initial encounter: Secondary | ICD-10-CM | POA: Diagnosis not present

## 2018-08-28 DIAGNOSIS — F17200 Nicotine dependence, unspecified, uncomplicated: Secondary | ICD-10-CM | POA: Diagnosis not present

## 2018-08-28 DIAGNOSIS — Y929 Unspecified place or not applicable: Secondary | ICD-10-CM | POA: Diagnosis not present

## 2018-08-28 DIAGNOSIS — Z79899 Other long term (current) drug therapy: Secondary | ICD-10-CM | POA: Insufficient documentation

## 2018-08-28 DIAGNOSIS — Y9389 Activity, other specified: Secondary | ICD-10-CM | POA: Diagnosis not present

## 2018-08-28 DIAGNOSIS — Y999 Unspecified external cause status: Secondary | ICD-10-CM | POA: Insufficient documentation

## 2018-08-28 NOTE — Discharge Instructions (Signed)
Please read and follow all provided instructions.  Your diagnoses today include:  1. Contusion of chest wall, unspecified laterality, initial encounter   2. Abrasion of scalp, initial encounter     Tests performed today include:  Vital signs. See below for your results today.    Chest x-ray - no problems  Medications prescribed:   None  Take any prescribed medications only as directed.  Home care instructions:  Follow any educational materials contained in this packet.  Follow-up instructions: Please follow-up with your primary care provider in the next 3 days for further evaluation of your symptoms if you aren't feeling better.   Return instructions:  SEEK IMMEDIATE MEDICAL ATTENTION IF:  There is confusion or drowsiness (although children frequently become drowsy after injury).   You cannot awaken the injured person.   You have more than one episode of vomiting.   You notice dizziness or unsteadiness which is getting worse, or inability to walk.   You have convulsions or unconsciousness.   You experience severe, persistent headaches not relieved by Tylenol.  You cannot use arms or legs normally.   There are changes in pupil sizes. (This is the black center in the colored part of the eye)   There is clear or bloody discharge from the nose or ears.   You have change in speech, vision, swallowing, or understanding.   Localized weakness, numbness, tingling, or change in bowel or bladder control.  You have any other emergent concerns.  Additional Information: You have had a minor head injury which does not appear to require admission at this time.  Your vital signs today were: BP 123/87 (BP Location: Right Arm)    Pulse 92    Temp 98 F (36.7 C) (Oral)    Resp 18    Wt 79 kg    SpO2 100%    BMI 27.28 kg/m  If your blood pressure (BP) was elevated above 135/85 this visit, please have this repeated by your doctor within one month. --------------
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
## 2018-08-29 NOTE — ED Notes (Addendum)
Pt came back to waiting room asking when he would be seen. RN explained to pt that he had been discharged and staff had been looking for him in the lobby. Pt requesting to wait in lobby. RN informed pt that he would not be allowed to wait in the lobby until morning due to COVID restrictions. Pt left  ED and refused to take his discharge papers with him.

## 2018-10-02 ENCOUNTER — Emergency Department (HOSPITAL_COMMUNITY)
Admission: EM | Admit: 2018-10-02 | Discharge: 2018-10-03 | Disposition: A | Discharge status: Home / Self Care | Payer: Medicaid Other | Attending: Emergency Medicine | Admitting: Emergency Medicine

## 2018-10-02 DIAGNOSIS — F10129 Alcohol abuse with intoxication, unspecified: Secondary | ICD-10-CM | POA: Insufficient documentation

## 2018-10-02 DIAGNOSIS — Y9389 Activity, other specified: Secondary | ICD-10-CM | POA: Diagnosis not present

## 2018-10-02 DIAGNOSIS — S0292XA Unspecified fracture of facial bones, initial encounter for closed fracture: Secondary | ICD-10-CM

## 2018-10-02 DIAGNOSIS — Z79899 Other long term (current) drug therapy: Secondary | ICD-10-CM | POA: Insufficient documentation

## 2018-10-02 DIAGNOSIS — Y998 Other external cause status: Secondary | ICD-10-CM | POA: Diagnosis not present

## 2018-10-02 DIAGNOSIS — Y929 Unspecified place or not applicable: Secondary | ICD-10-CM | POA: Insufficient documentation

## 2018-10-02 DIAGNOSIS — S0181XA Laceration without foreign body of other part of head, initial encounter: Secondary | ICD-10-CM

## 2018-10-02 DIAGNOSIS — S0990XA Unspecified injury of head, initial encounter: Secondary | ICD-10-CM | POA: Diagnosis present
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
## 2018-10-03 ENCOUNTER — Emergency Department (HOSPITAL_COMMUNITY): Payer: Medicaid Other

## 2018-10-03 ENCOUNTER — Other Ambulatory Visit: Payer: Self-pay

## 2018-10-03 ENCOUNTER — Encounter (HOSPITAL_COMMUNITY): Payer: Self-pay | Admitting: Emergency Medicine

## 2018-10-03 MED ORDER — LIDOCAINE-EPINEPHRINE (PF) 2 %-1:200000 IJ SOLN
10.0000 mL | Freq: Once | INTRAMUSCULAR | Status: AC
  Administered 2018-10-03: 10 mL
  Filled 2018-10-03: qty 20

## 2018-10-03 MED ORDER — HYDROCODONE-ACETAMINOPHEN 5-325 MG PO TABS: 1.0000 | ORAL_TABLET | ORAL | 0 refills | Status: DC | PRN

## 2018-10-03 MED ORDER — SULFAMETHOXAZOLE-TRIMETHOPRIM 800-160 MG PO TABS: 1.0000 | ORAL_TABLET | Freq: Two times a day (BID) | ORAL | 0 refills | Status: AC

## 2018-10-03 MED FILL — HYDROCODON-APAP 5-325: 5-325 | 6 days supply | Qty: 15 | Fill #0

## 2018-10-03 MED FILL — SULFAMETHOXAZOLE-TMP DS TAB: 800-160 | 7 days supply | Qty: 14 | Fill #0

## 2018-10-03 NOTE — Discharge Instructions (Addendum)
Your sutures will dissolve on their own, they do not need to be removed.  Watch for any redness or drainage that would indicate infection.  Return to the ER if infection occurs.  You need to follow-up with Dr. Katy Fitch to have your eye rechecked and Dr. Iran Planas to treat the multiple broken bones in your face.

## 2018-10-03 NOTE — ED Notes (Signed)
Patient transported to CT 

## 2018-10-03 NOTE — ED Notes (Signed)
Provided paper scrub top, cleaned face and dried blood from head.  Discussed discharge.  Patient will either take bus or cab.

## 2018-10-03 NOTE — ED Notes (Signed)
Pt found walking around room looking for cigarettes. Pt had removed C-collar and placed in trash can. This RN educated pt on importance of c-collar. Pt still refusing to wear c-collar. Pt expressing that he wishes to leave and doesn't want to be here. This RN helped redirect pt, educate him on importance of waiting for CT results, placed pt back into bed and call bell placed in plain sight for pt use.
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
## 2018-10-03 NOTE — ED Notes (Signed)
Discharge instructions reviewed with patient, discussed importance of followup with plastic surgery and the eye dr.  Discussed he needed to pick up his antiobotic and pain medication from the outpatient pharmacy.  Patient verbalized understanding.

## 2018-11-06 ENCOUNTER — Emergency Department (HOSPITAL_COMMUNITY)
Admission: EM | Admit: 2018-11-06 | Discharge: 2018-11-06 | Disposition: A | Discharge status: Home / Self Care | Payer: Medicaid Other | Attending: Emergency Medicine | Admitting: Emergency Medicine

## 2018-11-06 ENCOUNTER — Encounter (HOSPITAL_COMMUNITY): Payer: Self-pay | Admitting: Emergency Medicine

## 2018-11-06 ENCOUNTER — Other Ambulatory Visit: Payer: Self-pay

## 2018-11-06 DIAGNOSIS — F250 Schizoaffective disorder, bipolar type: Secondary | ICD-10-CM | POA: Diagnosis not present

## 2018-11-06 DIAGNOSIS — F17210 Nicotine dependence, cigarettes, uncomplicated: Secondary | ICD-10-CM | POA: Insufficient documentation

## 2018-11-06 DIAGNOSIS — R42 Dizziness and giddiness: Secondary | ICD-10-CM | POA: Diagnosis present

## 2018-11-06 DIAGNOSIS — R110 Nausea: Secondary | ICD-10-CM | POA: Diagnosis not present

## 2018-11-06 DIAGNOSIS — H6121 Impacted cerumen, right ear: Secondary | ICD-10-CM | POA: Insufficient documentation

## 2018-11-06 LAB — BASIC METABOLIC PANEL
Anion gap: 12 (ref 5–15)
BUN: 12 mg/dL (ref 6–20)
CO2: 21 mmol/L — ABNORMAL LOW (ref 22–32)
Calcium: 8.9 mg/dL (ref 8.9–10.3)
Chloride: 103 mmol/L (ref 98–111)
Creatinine, Ser: 0.88 mg/dL (ref 0.61–1.24)
GFR calc Af Amer: >60 mL/min (ref >60)
GFR calc non Af Amer: >60 mL/min (ref >60)
Glucose, Bld: 99 mg/dL (ref 70–99)
Potassium: 3.5 mmol/L (ref 3.5–5.1)
Sodium: 136 mmol/L (ref 135–145)

## 2018-11-06 LAB — CBC
HCT: 48.6 % (ref 39.0–52.0)
Hemoglobin: 16 g/dL (ref 13.0–17.0)
MCH: 29 pg (ref 26.0–34.0)
MCHC: 32.9 g/dL (ref 30.0–36.0)
MCV: 88.2 fL (ref 80.0–100.0)
Platelets: 165 10*3/uL (ref 150–400)
RBC: 5.51 MIL/uL (ref 4.22–5.81)
RDW: 15.5 % (ref 11.5–15.5)
WBC: 5.6 10*3/uL (ref 4.0–10.5)
nRBC: 0 % (ref 0.0–0.2)
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
## 2018-12-04 ENCOUNTER — Emergency Department (HOSPITAL_COMMUNITY): Payer: Medicaid Other

## 2018-12-04 ENCOUNTER — Emergency Department (HOSPITAL_COMMUNITY)
Admission: EM | Admit: 2018-12-04 | Discharge: 2018-12-04 | Discharge status: Against Medical Advice | Payer: Medicaid Other | Attending: Emergency Medicine | Admitting: Emergency Medicine

## 2018-12-04 ENCOUNTER — Other Ambulatory Visit: Payer: Self-pay

## 2018-12-04 DIAGNOSIS — Z5321 Procedure and treatment not carried out due to patient leaving prior to being seen by health care provider: Secondary | ICD-10-CM | POA: Diagnosis not present

## 2018-12-04 DIAGNOSIS — R0602 Shortness of breath: Secondary | ICD-10-CM | POA: Diagnosis present

## 2018-12-04 NOTE — ED Triage Notes (Addendum)
To ED for eval of sob for 'past 72 hrs'. States this sob has been waking him up in the middle of the night. Denies cp. States he rides a bike or walks 'all the time'. No ETOH and states no drugs for past year. Pt does not want blood work done but states he will allow a chest x-ray.

## 2018-12-04 NOTE — ED Notes (Signed)
Pt seen leaving the ED, did not notify staff of reason for leaving

## 2018-12-17 ENCOUNTER — Other Ambulatory Visit: Payer: Self-pay

## 2018-12-17 ENCOUNTER — Encounter (HOSPITAL_COMMUNITY): Payer: Self-pay | Admitting: Emergency Medicine

## 2018-12-17 ENCOUNTER — Emergency Department (HOSPITAL_COMMUNITY): Payer: Medicaid Other

## 2018-12-17 ENCOUNTER — Emergency Department (HOSPITAL_COMMUNITY)
Admission: EM | Admit: 2018-12-17 | Discharge: 2018-12-17 | Disposition: A | Discharge status: Home / Self Care | Payer: Medicaid Other | Attending: Emergency Medicine | Admitting: Emergency Medicine

## 2018-12-17 DIAGNOSIS — R0600 Dyspnea, unspecified: Secondary | ICD-10-CM | POA: Insufficient documentation

## 2018-12-17 DIAGNOSIS — R0602 Shortness of breath: Secondary | ICD-10-CM | POA: Diagnosis present

## 2018-12-17 DIAGNOSIS — Z87891 Personal history of nicotine dependence: Secondary | ICD-10-CM

## 2018-12-17 DIAGNOSIS — Z79899 Other long term (current) drug therapy: Secondary | ICD-10-CM | POA: Insufficient documentation

## 2018-12-17 DIAGNOSIS — F17210 Nicotine dependence, cigarettes, uncomplicated: Secondary | ICD-10-CM | POA: Insufficient documentation

## 2018-12-17 MED ORDER — ALBUTEROL SULFATE HFA 108 (90 BASE) MCG/ACT IN AERS
2.0000 | INHALATION_SPRAY | Freq: Once | RESPIRATORY_TRACT | Status: AC
  Administered 2018-12-17: 2 via RESPIRATORY_TRACT
  Filled 2018-12-17: qty 6.7

## 2018-12-17 MED ORDER — ALBUTEROL SULFATE HFA 108 (90 BASE) MCG/ACT IN AERS: 2.0000 | INHALATION_SPRAY | RESPIRATORY_TRACT | 1 refills | Status: AC | PRN

## 2018-12-17 NOTE — Discharge Instructions (Addendum)
It was our pleasure to provide your ER care today - we hope that you feel better.  Avoid smoking, as continued smoking will continue to make it harder for your to breathe.   Use albuterol inhaler 2 puffs every 4-6 hours as need.   Follow up with primary care doctor in the next 1-2 weeks.   Return to ER if worse, new symptoms, fevers, chest pain, increased trouble breathing, or other concern.

## 2018-12-17 NOTE — ED Provider Notes (Signed)
[facility]   CSN: [id number] Arrival date & time: 12/17/18  1321     History   Chief Complaint No chief complaint on file.   HPI Robie Ridgeimothy G Monte is a 53 y.o. male.     Patient c/o intermittent sob/wheezy feeling in the past 1-2 years. Symptoms persistent, recurrent for past 1-2 years without acute or abrupt worsening today or this week. Denies hx copd/asthma, but is 2 ppd smoker for several years.  Denies cough or sore throat. No known covid+ exposure. No fever or chills. Denies chest pain or discomfort. No leg pain or swelling. Denies orthopnea or pnd. Normal appetite. No wt loss.   The history is provided by the patient.    Past Medical History:  Diagnosis Date  . Bipolar affective (HCC)   . Mood disorder (HCC) 08/21/2017  . Schizophrenia, acute [facility](HCC)     Patient Active Problem List   Diagnosis Date Noted  . MDD (major depressive disorder), recurrent, severe, with psychosis (HCC) 06/07/2018  . Syncope 08/21/2017  . Mood disorder (HCC) 08/21/2017    Past Surgical History:  Procedure Laterality Date  . FRACTURE SURGERY    . NO PAST SURGERIES          Home Medications    Prior to Admission medications   Medication Sig Start Date End Date Taking? Authorizing Provider  FLUoxetine (PROZAC) 20 MG capsule Take 1 capsule (20 mg total) by mouth daily. 06/08/18   Malvin JohnsFarah, Brian, MD    Family History No family history on file.  Social History Social History   Tobacco Use  . Smoking status: Current Every Day Smoker    Packs/day: 2.00    Types: Cigarettes  . Smokeless tobacco: Never Used  Substance Use Topics  . Alcohol use: Yes    Comment: 1-2 cases/day  . Drug use: Not Currently    Types: Cocaine    Comment: yesterday crack cocaine 11-02-17     Allergies   Patient has no known allergies.   Review of Systems Review of Systems  Constitutional: Negative for chills and fever.  HENT: Negative for sore  throat.   Eyes: Negative for redness.  Respiratory: Negative for cough.   Cardiovascular: Negative for chest pain, palpitations and leg swelling.  Gastrointestinal: Negative for abdominal pain, diarrhea and vomiting.  Endocrine: Negative for polyuria.  Genitourinary: Negative for flank pain.  Musculoskeletal: Negative for back pain and neck pain.  Skin: Negative for rash.  Neurological: Negative for light-headedness and headaches.  Hematological: Does not bruise/bleed easily.  Psychiatric/Behavioral: Negative for confusion.     Physical Exam Updated Vital Signs BP 127/87   Pulse 63   Temp 98.2 F (36.8 C) (Oral)   Resp 16   SpO2 100%   Physical Exam Vitals signs and nursing note reviewed.  Constitutional:      Appearance: Normal appearance. He is well-developed.  HENT:     Head: Atraumatic.     Nose: Nose normal.     Mouth/Throat:     Mouth: Mucous membranes are moist.     Pharynx: Oropharynx is clear. No oropharyngeal exudate or posterior oropharyngeal erythema.  Eyes:     General: No scleral icterus.    Conjunctiva/sclera: Conjunctivae normal.     Pupils: Pupils are equal, round, and reactive to light.  Neck:     Musculoskeletal: Normal range of motion and neck supple. No neck rigidity.     Trachea: No tracheal deviation.  Comments: Trachea midline. No neck masses/swelling. No jvd.  Cardiovascular:     Rate and Rhythm: Normal rate and regular rhythm.     Pulses: Normal pulses.     Heart sounds: Normal heart sounds. No murmur. No friction rub. No gallop.   Pulmonary:     Effort: Pulmonary effort is normal. No accessory muscle usage or respiratory distress.     Comments: Good air exchange. V sl wheezing.  Abdominal:     General: Bowel sounds are normal. There is no distension.     Palpations: Abdomen is soft. There is no mass.     Tenderness: There is no abdominal tenderness. There is no guarding.  Genitourinary:    Comments: No cva tenderness.  Musculoskeletal:        General: No swelling or tenderness.     Right lower leg: No edema.     Left lower leg: No edema.  Lymphadenopathy:     Cervical: No cervical adenopathy.  Skin:    General: Skin is warm and dry.     Findings: No rash.  Neurological:     Mental Status: He is alert.     Comments: Alert, speech clear, fluent, talking in full sentences. Steady gait.   Psychiatric:        Mood and Affect: Mood normal.      ED Treatments / Results  Labs (all labs ordered are listed, but only abnormal results are displayed) Labs Reviewed - No data to display  EKG EKG Interpretation  Date/Time:  Monday December 17 2018 13:23:59 EDT Ventricular Rate:  102 PR Interval:  138 QRS Duration: 86 QT Interval:  336 QTC Calculation: 437 R Axis:   20 Text Interpretation:  Sinus tachycardia No significant change since last tracing Confirmed by Lajean Saver [id number]) on 12/17/2018 2:42:32 PM   Radiology Dg Chest 2 View  Result Date: 12/17/2018 CLINICAL DATA:  Shortness of breath EXAM: CHEST - 2 VIEW COMPARISON:  12/04/2018 FINDINGS: Lungs are hyperexpanded which can be seen in patients with COPD. Heart size is stable. There is no focal infiltrate. No significant pleural effusion. No acute osseous abnormality. IMPRESSION: No active cardiopulmonary disease. Electronically Signed   By: Constance Holster M.D.   On: 12/17/2018 14:01    Procedures Procedures (including critical care time)  Medications Ordered in ED Medications  albuterol (VENTOLIN HFA) 108 (90 Base) MCG/ACT inhaler 2 puff (has no administration in time range)     Initial Impression / Assessment and Plan / ED Course  I have reviewed the triage vital signs and the nursing notes.  Pertinent labs & imaging results that were available during my care of the patient were reviewed by me and considered in my medical decision making (see chart for details).  CXR.  Reviewed nursing notes and prior charts for additional  history.   Albuterol mdi 2 puffs.   CXR reviewed by me - no pna.   Discussed smoking cessation, alb mdi uses prn w pt. Also discussed pcp f/u as outpt.   bp 127/87, hr 70, rr 16, pulse ox 100% room air. No chest pain or discomfort. Breathing comfortably.  Patient currently appears stable for d/c.   Return precautions provided.     Final Clinical Impressions(s) / ED Diagnoses   Final diagnoses:  None    ED Discharge Orders    None       Lajean Saver, MD 12/17/18 1451

## 2018-12-17 NOTE — ED Triage Notes (Signed)
Pt here today with c/o sob , pt states that he smokes and feels like he cant take a deep breath  Denis etoh and drugs

## 2018-12-18 ENCOUNTER — Other Ambulatory Visit: Payer: Self-pay

## 2018-12-18 ENCOUNTER — Emergency Department (HOSPITAL_COMMUNITY)
Admission: EM | Admit: 2018-12-18 | Discharge: 2018-12-19 | Discharge status: Against Medical Advice | Payer: Medicaid Other | Attending: Emergency Medicine | Admitting: Emergency Medicine

## 2018-12-18 DIAGNOSIS — Z5321 Procedure and treatment not carried out due to patient leaving prior to being seen by health care provider: Secondary | ICD-10-CM | POA: Diagnosis not present

## 2018-12-18 DIAGNOSIS — R0602 Shortness of breath: Secondary | ICD-10-CM | POA: Diagnosis present

## 2018-12-18 NOTE — ED Triage Notes (Signed)
Pt called without an answer, charge RN notified re: pt not answering, pt moved off the floor

## 2018-12-18 NOTE — ED Triage Notes (Signed)
Pt states that his lungs dont seem to want take the breath in.  Pt is sleepy but arousable and states he has been up since 4 am yesterday.

## 2018-12-20 ENCOUNTER — Other Ambulatory Visit: Payer: Self-pay

## 2018-12-20 ENCOUNTER — Emergency Department (HOSPITAL_COMMUNITY)
Admission: EM | Admit: 2018-12-20 | Discharge: 2018-12-20 | Disposition: A | Discharge status: Home / Self Care | Payer: Medicaid Other | Attending: Emergency Medicine | Admitting: Emergency Medicine

## 2018-12-20 ENCOUNTER — Encounter (HOSPITAL_COMMUNITY): Payer: Self-pay | Admitting: Emergency Medicine

## 2018-12-20 DIAGNOSIS — Z79899 Other long term (current) drug therapy: Secondary | ICD-10-CM | POA: Insufficient documentation

## 2018-12-20 DIAGNOSIS — R0602 Shortness of breath: Secondary | ICD-10-CM | POA: Insufficient documentation

## 2018-12-20 DIAGNOSIS — F17210 Nicotine dependence, cigarettes, uncomplicated: Secondary | ICD-10-CM | POA: Diagnosis not present

## 2018-12-20 NOTE — ED Notes (Signed)
Patient verbalizes understanding of discharge instructions. Opportunity for questioning and answers were provided. Armband removed by staff, pt discharged from ED ambulatory.   

## 2018-12-20 NOTE — ED Provider Notes (Signed)
[facility]   CSN: [id number] Arrival date & time: 12/20/18  2002     History   Chief Complaint Chief Complaint  Patient presents with  . Shortness of Breath    HPI Francisco Anderson is a [age] y.o. male who presents to the ED for shortness of breath.  Patient was in here 3 days ago for evaluation of the same complaint and was given albuterol which he states slightly improved his symptoms.  Since discharge however, he states that the albuterol has not relieved his shortness of breath and that he has even woken up in the middle of the night "gasping for air".  He states that he does have a mildly productive cough, but it only occurs when actively smoking cigarettes and it is a clear-white sputum.  He denies any dyspnea on exertion and feels as though his shortness of breath has not limited his physical activity whatsoever.  He continues to smoke approximately 2 packs/day, but states that he knows it is important for him to quit and that they likely could be causing his symptoms.  He denies any wheezing, chest pain, abdominal pain, lightheadedness, dizziness, ulcers, headache, nausea, vomiting, leg swelling, leg pain, history of clots, changes in bowel habits, or urinary symptoms.      HPI  Past Medical History:  Diagnosis Date  . Bipolar affective (HCC)   . Mood disorder (HCC) 08/21/2017  . Schizophrenia, acute [facility](HCC)     Patient Active Problem List   Diagnosis Date Noted  . MDD (major depressive disorder), recurrent, severe, with psychosis (HCC) 06/07/2018  . Syncope 08/21/2017  . Mood disorder (HCC) 08/21/2017    Past Surgical History:  Procedure Laterality Date  . FRACTURE SURGERY    . NO PAST SURGERIES          Home Medications    Prior to Admission medications   Medication Sig Start Date End Date Taking? Authorizing Provider  albuterol (VENTOLIN HFA) 108 (90 Base) MCG/ACT inhaler Inhale 2 puffs into the lungs every 4  (four) hours as needed for wheezing or shortness of breath. 12/17/18   Cathren LaineSteinl, Kevin, MD  FLUoxetine (PROZAC) 20 MG capsule Take 1 capsule (20 mg total) by mouth daily. 06/08/18   Malvin JohnsFarah, Brian, MD    Family History No family history on file.  Social History Social History   Tobacco Use  . Smoking status: Current Every Day Smoker    Packs/day: 2.00    Types: Cigarettes  . Smokeless tobacco: Never Used  Substance Use Topics  . Alcohol use: Yes    Comment: 1-2 cases/day  . Drug use: Not Currently    Types: Cocaine    Comment: yesterday crack cocaine 11-02-17     Allergies   Patient has no known allergies.   Review of Systems Review of Systems  All other systems reviewed and are negative.    Physical Exam Updated Vital Signs BP 127/75 (BP Location: Left Arm)   Pulse 83   Temp 97.6 F (36.4 C) (Oral)   Resp 20   Ht 5\' 7"  (1.702 m)   Wt 75.8 kg   SpO2 99%   BMI 26.16 kg/m   Physical Exam Constitutional:      Appearance: Normal appearance.  HENT:     Head: Normocephalic and atraumatic.     Mouth/Throat:     Pharynx: Oropharynx is clear.  Eyes:     General: No scleral icterus.    Extraocular Movements: Extraocular  movements intact.     Conjunctiva/sclera: Conjunctivae normal.     Pupils: Pupils are equal, round, and reactive to light.  Neck:     Musculoskeletal: Normal range of motion and neck supple.  Cardiovascular:     Rate and Rhythm: Normal rate and regular rhythm.     Pulses: Normal pulses.     Heart sounds: Normal heart sounds.  Pulmonary:     Effort: Pulmonary effort is normal. No respiratory distress.     Breath sounds: Normal breath sounds. No stridor. No wheezing, rhonchi or rales.  Abdominal:     General: Abdomen is flat. There is no distension.     Palpations: Abdomen is soft.     Tenderness: There is no abdominal tenderness. There is no guarding.  Musculoskeletal:     Comments: No lower leg swelling, erythema, tenderness, or edema.  Skin:     Capillary Refill: Capillary refill takes less than 2 seconds.  Neurological:     General: No focal deficit present.     Mental Status: He is alert and oriented to person, place, and time.     GCS: GCS eye subscore is 4. GCS verbal subscore is 5. GCS motor subscore is 6.  Psychiatric:        Mood and Affect: Mood normal.        Behavior: Behavior normal.        Thought Content: Thought content normal.      ED Treatments / Results  Labs (all labs ordered are listed, but only abnormal results are displayed) Labs Reviewed - No data to display  EKG None  Radiology No results found.  Procedures Procedures (including critical care time)  Medications Ordered in ED Medications - No data to display   Initial Impression / Assessment and Plan / ED Course  I have reviewed the triage vital signs and the nursing notes.  Pertinent labs & imaging results that were available during my care of the patient were reviewed by me and considered in my medical decision making (see chart for details).       Reviewed patient's chart which revealed that he had been having these symptoms of shortness of breath waking him up in the middle of the night for nearly three weeks.  Patient is unchanged from evaluation and discharge 3 days ago.  Patient states that his physical activity is not limited and that his productive cough is purely associated with his cigarette use.  Reviewed chest x-ray from 3 days ago which reveals no consolidation or evidence of pneumonia.  Patient remains afebrile and his vital signs are stable.   Patient is oxygenating well on room air.  He denies personal history of clots or recent leg swelling.  No leg swelling noted on physical exam.  No recent immobilization, in fact he has been very active.  He is neither tachycardic nor tachypneic and I do not suspect pulmonary embolism.   Patient denies any sick contacts, possible COVID-19 exposure, and denies any headache, nausea,  vomiting, fevers, chills, cough unrelated to cigarette use, fatigue, or other symptoms concerning for infection.  Discussed why believe he did not require diagnostic testing at this point and he agreed.  He states that he has an appointment lined up in 11 days with a pulmonologist here in [place].  He attributes his shortness of breath and cough to his cigarettes.  I encouraged him to quit smoking cigarettes, at least until he received formal evaluation from pulmonologist or until symptoms resolve. He  has a 70-pack-year smoking history.   Do not suspect CHF as he does not have any peripheral edema, denies any recent weight gain, and has not had any worsening dyspnea on exertion. Do not suspect cardiac etiology, instead related to his extensive smoking history.   I explained to him I am not believe that lab work or imaging is warranted at this time.  Considered a prednisone burst, but no wheezing on exam. Instructed to resume albuterol if he develops a wheeze. Best management until pulmonary consult is smoking cessation. He voiced understanding and is agreeable plan.   The patient was counseled on the dangers of tobacco use, and was advised to quit.  Reviewed strategies to maximize success, including removing cigarettes and smoking materials from environment, stress management, substitution of other forms of reinforcement, support of family/friends and written materials. Total time was 5 min CPT code 99406.     Final Clinical Impressions(s) / ED Diagnoses   Final diagnoses:  Shortness of breath    ED Discharge Orders    None       Corena Herter, PA-C 12/20/18 2217    Maudie Flakes, MD 12/22/18 [phone]

## 2018-12-20 NOTE — ED Triage Notes (Addendum)
Pt to ED with c/o shortness of breath.  Pt has been seen several times for same and given inhaler.  St's he has used it without relief.

## 2018-12-20 NOTE — Discharge Instructions (Signed)
Please review attachment.  Resume albuterol if you develop a wheeze.  Please be sure not to miss your reported appointment with pulmonology in 11 days.  Return to the ED if develop fever, chills, respiratory distress, change in sputum character, chest pain, or if you develop other new or worsening symptoms.

## 2018-12-23 ENCOUNTER — Emergency Department (HOSPITAL_COMMUNITY)
Admission: EM | Admit: 2018-12-23 | Discharge: 2018-12-24 | Disposition: A | Discharge status: Home / Self Care | Payer: Medicaid Other | Attending: Emergency Medicine | Admitting: Emergency Medicine

## 2018-12-23 ENCOUNTER — Other Ambulatory Visit: Payer: Self-pay

## 2018-12-23 ENCOUNTER — Emergency Department (HOSPITAL_COMMUNITY): Payer: Medicaid Other

## 2018-12-23 ENCOUNTER — Encounter (HOSPITAL_COMMUNITY): Payer: Self-pay | Admitting: *Deleted

## 2018-12-23 DIAGNOSIS — J441 Chronic obstructive pulmonary disease with (acute) exacerbation: Secondary | ICD-10-CM | POA: Diagnosis not present

## 2018-12-23 DIAGNOSIS — R0602 Shortness of breath: Secondary | ICD-10-CM | POA: Diagnosis present

## 2018-12-23 DIAGNOSIS — F17210 Nicotine dependence, cigarettes, uncomplicated: Secondary | ICD-10-CM | POA: Insufficient documentation

## 2018-12-23 MED ORDER — DEXAMETHASONE SODIUM PHOSPHATE 10 MG/ML IJ SOLN
10.0000 mg | Freq: Once | INTRAMUSCULAR | Status: AC
  Administered 2018-12-24: 10 mg via INTRAMUSCULAR
  Filled 2018-12-23: qty 1

## 2018-12-23 MED ORDER — IPRATROPIUM BROMIDE 0.02 % IN SOLN
0.5000 mg | Freq: Once | RESPIRATORY_TRACT | Status: AC
  Administered 2018-12-24: 0.5 mg via RESPIRATORY_TRACT
  Filled 2018-12-23: qty 2.5

## 2018-12-23 MED ORDER — ALBUTEROL SULFATE (2.5 MG/3ML) 0.083% IN NEBU
5.0000 mg | INHALATION_SOLUTION | Freq: Once | RESPIRATORY_TRACT | Status: AC
  Administered 2018-12-24: 5 mg via RESPIRATORY_TRACT
  Filled 2018-12-23: qty 6

## 2018-12-23 NOTE — ED Provider Notes (Signed)
[facility] EMERGENCY DEPARTMENT Provider Note   CSN: [id number] Arrival date & time: 12/23/18  2156     History   Chief Complaint Chief Complaint  Patient presents with  . Shortness of Breath    HPI Francisco Anderson is a 53 y.o. male with a history of schizophrenia and bipolar affective disorder who presents to the emergency department with a chief complaint of shortness of breath.  The patient states that his "breath catches when he is breathing".  He reports that this is been ongoing for several weeks and is accompanied by wheezing.  He has been seen 2 previous times in the ER over the last week for the same.  He was discharged with an albuterol inhaler which he states that he is using approximately every 3 to 3-1/2 hours despite being advised to use 2 puffs every 4-6 hours.  He has not currently established with a primary care provider, but has an upcoming appointment on September 28.  He reports that he continues to smoke 1.5 packs of cigarettes daily.  He states that he returns to the ER today because his symptoms have not improved with albuterol inhaler.  He is not having any chest pain, leg swelling, dizziness, lightheadedness, back pain, neck pain, paresthesias, weakness, numbness, abdominal pain, nausea, vomiting, diarrhea, loss of sense of taste or smell.  No known COVID-19 contacts.     The history is provided by the patient. No language interpreter was used.    Past Medical History:  Diagnosis Date  . Bipolar affective ([place])   . Mood disorder ([place]) 08/21/2017  . Schizophrenia, acute [facility])     Patient Active Problem List   Diagnosis Date Noted  . MDD (major depressive disorder), recurrent, severe, with psychosis ([place]) 06/07/2018  . Syncope 08/21/2017  . Mood disorder ([place]) 08/21/2017    Past Surgical History:  Procedure Laterality Date  . FRACTURE SURGERY    . NO PAST SURGERIES          Home Medications    Prior to Admission medications    Medication Sig Start Date End Date Taking? Authorizing Provider  albuterol (VENTOLIN HFA) 108 (90 Base) MCG/ACT inhaler Inhale 2 puffs into the lungs every 4 (four) hours as needed for wheezing or shortness of breath. 12/17/18  Yes Lajean Saver, MD  FLUoxetine (PROZAC) 20 MG capsule Take 1 capsule (20 mg total) by mouth daily. Patient not taking: Reported on 12/23/2018 06/08/18 12/24/18  Johnn Hai, MD    Family History No family history on file.  Social History Social History   Tobacco Use  . Smoking status: Current Every Day Smoker    Packs/day: 2.00    Types: Cigarettes  . Smokeless tobacco: Never Used  Substance Use Topics  . Alcohol use: Yes    Comment: 1-2 cases/day  . Drug use: Not Currently    Types: Cocaine    Comment: yesterday crack cocaine 11-02-17     Allergies   Patient has no known allergies.   Review of Systems Review of Systems   Physical Exam Updated Vital Signs BP 107/60 (BP Location: Right Arm)   Pulse 94   Temp 98 F (36.7 C) (Oral)   Resp 18   SpO2 95%   Physical Exam Vitals signs and nursing note reviewed.  Constitutional:      General: He is not in acute distress.    Appearance: He is well-developed. He is not ill-appearing, toxic-appearing or diaphoretic.     Comments: Patient  is eating a sandwich.   HENT:     Head: Normocephalic.  Eyes:     Conjunctiva/sclera: Conjunctivae normal.  Neck:     Musculoskeletal: Neck supple.  Cardiovascular:     Rate and Rhythm: Normal rate and regular rhythm.     Heart sounds: No murmur.  Pulmonary:     Effort: Pulmonary effort is normal.     Breath sounds: No stridor. Wheezing present.     Comments: Diffuse end expiratory wheezes most notable in the bilateral upper fields.  Good air movement throughout.  No rhonchi or rales.  No accessory muscle use or retractions.  No respiratory distress. Abdominal:     General: There is no distension.     Palpations: Abdomen is soft. There is no mass.      Tenderness: There is no abdominal tenderness. There is no right CVA tenderness, left CVA tenderness, guarding or rebound.     Hernia: No hernia is present.  Musculoskeletal:     Right lower leg: No edema.     Left lower leg: No edema.  Skin:    General: Skin is warm and dry.  Neurological:     Mental Status: He is alert.  Psychiatric:        Behavior: Behavior normal.      ED Treatments / Results  Labs (all labs ordered are listed, but only abnormal results are displayed) Labs Reviewed - No data to display  EKG None  Radiology Dg Chest 2 View  Result Date: 12/23/2018 CLINICAL DATA:  Shortness of breath EXAM: CHEST - 2 VIEW COMPARISON:  December 17, 2018 FINDINGS: The heart size and mediastinal contours are within normal limits. Both lungs are clear. The visualized skeletal structures are unremarkable. Bilateral symmetric nodular opacities, likely nipple shadows. IMPRESSION: No active cardiopulmonary disease. Electronically Signed   By: Jonna ClarkBindu  Avutu M.D.   On: 12/23/2018 23:11    Procedures Procedures (including critical care time)  Medications Ordered in ED Medications  albuterol (PROVENTIL) (2.5 MG/3ML) 0.083% nebulizer solution 5 mg (5 mg Nebulization Given 12/24/18 0017)  ipratropium (ATROVENT) nebulizer solution 0.5 mg (0.5 mg Nebulization Given 12/24/18 0017)  dexamethasone (DECADRON) injection 10 mg (10 mg Intramuscular Given 12/24/18 0014)     Initial Impression / Assessment and Plan / ED Course  I have reviewed the triage vital signs and the nursing notes.  Pertinent labs & imaging results that were available during my care of the patient were reviewed by me and considered in my medical decision making (see chart for details).        53 year old male with a history of schizophrenia and bipolar affective disorder presenting with persistent shortness of breath and wheezing in the setting of being a 1.5 pack/day tobacco user.  Symptoms are improved with albuterol  inhaler use, but have not resolved, which is his primary concern.  He has no constitutional symptoms, cough, chest pain, or leg swelling.  Vital signs in the ER are normal and reassuring.  On exam, he has good air movement bilaterally with end expiratory wheezes that are more prominent in the bilateral apices.  No tachypnea or hypoxia.  Chest x-ray is unremarkable.  Patient is eating a sandwich and is in no acute distress on my initial evaluation.  Suspect mild COPD exacerbation.  I have a low suspicion for ACS, PE, congestive heart failure, aortic dissection, COVID-19, pneumonia, tension pneumothorax, or esophageal rupture.  Given that this is the patient's third visit in the last 3 weeks for the  same, will give Decadron injection and albuterol nebulizer treatment until the patient can be seen for his initial PCP appointment next week.  He was ambulated in the ER with SaO2 of 95% and was asymptomatic. Discussed smoking cessation with patient and was they were offerred resources to help stop.  Total time was 5 min CPT code 83094.   At this time, I feel that no further urgent or emergent work-up is indicated.  He is hemodynamically stable and in no acute distress.  Safe for discharge home with outpatient follow-up. Final Clinical Impressions(s) / ED Diagnoses   Final diagnoses:  COPD exacerbation [facility])    ED Discharge Orders    None       Barkley Boards, PA-C 12/24/18 0126    Tegeler, Canary Brim, MD 12/25/18 [phone]

## 2018-12-23 NOTE — ED Triage Notes (Signed)
Pt arrives with c/o that it is hard to catch his breath, unchanged from the prior visits he has had. Says that his inhaler is just not working. Reports a white mucous "only when I smoke".

## 2018-12-24 NOTE — ED Notes (Signed)
Pt verbalized understanding of d/c instructions, inhaler and follow up.  no further questions. Pt ambulatory to [facility].

## 2018-12-24 NOTE — Discharge Instructions (Addendum)
Thank you for allowing me to care for you today in the Emergency Department.   Keep your follow-up appointment on September 28.  Your symptoms are likely persistent due to continued cigarette use.  As we discussed, you should stop smoking.  Continue to use 2 puffs of your inhaler every 4-6 hours as needed.  Return to the emergency department if you develop respiratory distress, if you pass out, if your fingers or lips turn blue, if you develop shortness of breath with severe chest pain or leg swelling, or other new, concerning symptoms.

## 2018-12-24 NOTE — ED Notes (Signed)
Pt ambulated in hallway with no assistance. O2 sat stayed at 95% while ambulating.

## 2018-12-26 ENCOUNTER — Emergency Department (HOSPITAL_COMMUNITY)
Admission: EM | Admit: 2018-12-26 | Discharge: 2018-12-26 | Disposition: A | Discharge status: Home / Self Care | Payer: Medicaid Other | Attending: Emergency Medicine | Admitting: Emergency Medicine

## 2018-12-26 ENCOUNTER — Emergency Department (HOSPITAL_COMMUNITY): Payer: Medicaid Other

## 2018-12-26 DIAGNOSIS — F17210 Nicotine dependence, cigarettes, uncomplicated: Secondary | ICD-10-CM | POA: Diagnosis not present

## 2018-12-26 DIAGNOSIS — R0602 Shortness of breath: Secondary | ICD-10-CM | POA: Insufficient documentation

## 2018-12-26 MED ORDER — AEROCHAMBER Z-STAT PLUS/MEDIUM MISC
1.0000 | Freq: Once | Status: AC
  Administered 2018-12-26: 1
  Filled 2018-12-26: qty 1

## 2018-12-26 MED ORDER — IPRATROPIUM-ALBUTEROL 0.5-2.5 (3) MG/3ML IN SOLN: 3.0000 mL | Freq: Once | RESPIRATORY_TRACT | Status: DC

## 2018-12-26 MED ORDER — ALBUTEROL SULFATE HFA 108 (90 BASE) MCG/ACT IN AERS
1.0000 | INHALATION_SPRAY | RESPIRATORY_TRACT | Status: DC | PRN
  Administered 2018-12-26: 2 via RESPIRATORY_TRACT
  Filled 2018-12-26: qty 6.7

## 2018-12-26 NOTE — ED Notes (Signed)
Patient given food and a soda per request.

## 2018-12-26 NOTE — Discharge Instructions (Signed)
You have been diagnosed today with Shortness of Breath.  At this time there does not appear to be the presence of an emergent medical condition, however there is always the potential for conditions to change. Please read and follow the below instructions.  Please return to the Emergency Department immediately for any new or worsening symptoms. Please be sure to follow up with your Primary Care Provider within one week regarding your visit today; please call their office to schedule an appointment even if you are feeling better for a follow-up visit. You may use the albuterol inhaler and spacer given to you today for shortness of breath. Please use the resource guide given today to find local resources and shelters.  Get help right away if: Your shortness of breath gets worse. You have trouble breathing when you are resting. You feel light-headed or you pass out (faint). You have a cough that is not helped by medicines. You cough up blood. You have pain with breathing. You have pain in your chest, arms, shoulders, or belly (abdomen). You have a fever. You cannot walk up stairs. You cannot exercise the way you normally do. You have any new/concerning or worsening symptoms  Please read the additional information packets attached to your discharge summary.  Do not take your medicine if  develop an itchy rash, swelling in your mouth or lips, or difficulty breathing; call 911 and seek immediate emergency medical attention if this occurs.  Note: Portions of this text may have been transcribed using voice recognition software. Every effort was made to ensure accuracy; however, inadvertent computerized transcription errors may still be present.

## 2018-12-26 NOTE — ED Triage Notes (Signed)
Per [facility] pt homeless and coming from side of road for SOB that has been intermittent over the past week. EMS reports lung sounds clear. 95% ON ra. Is worse when smokes cigarettes

## 2018-12-26 NOTE — ED Notes (Signed)
Pt ambulated out of the department with no difficulty.

## 2018-12-26 NOTE — ED Notes (Signed)
Attempted to ambulate patient, pt refusing to get up and walk stating he can walk but wants to stay in bed. This nurse explained we would like to make sure he does not get short of breath while walking, pt states "I am not short of breath and I am not getting up." This nurse made [place] aware.

## 2018-12-26 NOTE — ED Provider Notes (Addendum)
[facility] DEPT Provider Note   CSN: [id number] Arrival date & time: 12/26/18  1455     History   Chief Complaint Chief Complaint  Patient presents with   Shortness of Breath    HPI Francisco Anderson is a 53 y.o. male with history of schizophrenia, bipolar, homelessness presents today via EMS for shortness of breath.  Patient found on side of the road complaining of shortness of breath for the past week, intermittent.  No hypoxia on room air per EMS.  Patient reports shortness of breath worse with smoking cigarettes.  On initial evaluation patient is sleeping comfortably easily arousable to voice.  Reports occasional shortness of breath but does not give a clear timeframe of when this occurs.  He is requesting food and to continue sleeping.  He denies any drug or alcohol use today.  Denies fevers, headache, chest pain, abdominal pain, nausea/vomiting, drug/alcohol use, extremity pain/swelling or any additional concerns.    HPI  Past Medical History:  Diagnosis Date   Bipolar affective ([place])    Mood disorder ([place]) 08/21/2017   Schizophrenia, acute [facility])     Patient Active Problem List   Diagnosis Date Noted   MDD (major depressive disorder), recurrent, severe, with psychosis ([place]) 06/07/2018   Syncope 08/21/2017   Mood disorder ([place]) 08/21/2017    Past Surgical History:  Procedure Laterality Date   FRACTURE SURGERY     NO PAST SURGERIES          Home Medications    Prior to Admission medications   Medication Sig Start Date End Date Taking? Authorizing Provider  albuterol (VENTOLIN HFA) 108 (90 Base) MCG/ACT inhaler Inhale 2 puffs into the lungs every 4 (four) hours as needed for wheezing or shortness of breath. 12/17/18  Yes Lajean Saver, MD  FLUoxetine (PROZAC) 20 MG capsule Take 1 capsule (20 mg total) by mouth daily. Patient not taking: Reported on 12/23/2018 06/08/18 12/24/18  Johnn Hai, MD    Family History No family  history on file.  Social History Social History   Tobacco Use   Smoking status: Current Every Day Smoker    Packs/day: 2.00    Types: Cigarettes   Smokeless tobacco: Never Used  Substance Use Topics   Alcohol use: Yes    Comment: 1-2 cases/day   Drug use: Not Currently    Types: Cocaine    Comment: yesterday crack cocaine 11-02-17     Allergies   Patient has no known allergies.   Review of Systems Review of Systems Ten systems are reviewed and are negative for acute change except as noted in the HPI  Physical Exam Updated Vital Signs BP 117/68    Pulse 83    Temp 98.2 F (36.8 C) (Oral)    Resp (!) 23    SpO2 97%   Physical Exam Constitutional:      General: He is not in acute distress.    Appearance: Normal appearance. He is well-developed. He is not ill-appearing or diaphoretic.  HENT:     Head: Normocephalic and atraumatic.     Right Ear: External ear normal.     Left Ear: External ear normal.     Nose: Nose normal.  Eyes:     General: Vision grossly intact. Gaze aligned appropriately.     Pupils: Pupils are equal, round, and reactive to light.  Neck:     Musculoskeletal: Normal range of motion.     Trachea: Trachea and phonation normal. No tracheal deviation.  Cardiovascular:     Rate and Rhythm: Normal rate and regular rhythm.  Pulmonary:     Effort: Pulmonary effort is normal. No respiratory distress.     Breath sounds: Normal breath sounds.  Musculoskeletal: Normal range of motion.     Right lower leg: He exhibits no tenderness. No edema.     Left lower leg: He exhibits no tenderness. No edema.  Skin:    General: Skin is warm and dry.  Neurological:     Mental Status: He is alert.     GCS: GCS eye subscore is 4. GCS verbal subscore is 5. GCS motor subscore is 6.     Comments: Speech is clear and goal oriented, follows commands Major Cranial nerves without deficit, no facial droop Moves extremities without ataxia, coordination intact  Psychiatric:         Behavior: Behavior normal.    ED Treatments / Results  Labs (all labs ordered are listed, but only abnormal results are displayed) Labs Reviewed - No data to display  EKG EKG Interpretation  Date/Time:  Wednesday December 26 2018 22:01:28 EDT Ventricular Rate:  72 PR Interval:    QRS Duration: 104 QT Interval:  400 QTC Calculation: 438 R Axis:   19 Text Interpretation:  Sinus rhythm no STEMI Confirmed by Marianna Fuss ([id number]) on 12/26/2018 10:42:34 PM   Radiology Dg Chest 2 View  Result Date: 12/26/2018 CLINICAL DATA:  Shortness of breath EXAM: CHEST - 2 VIEW COMPARISON:  12/23/2018 FINDINGS: Heart and mediastinal contours are within normal limits. No focal opacities or effusions. No acute bony abnormality. Nipple shadows again noted projecting over the lower lungs, stable. IMPRESSION: No active cardiopulmonary disease. Electronically Signed   By: Charlett Nose M.D.   On: 12/26/2018 21:57    Procedures Procedures (including critical care time)  Medications Ordered in ED Medications  albuterol (VENTOLIN HFA) 108 (90 Base) MCG/ACT inhaler 1-2 puff (2 puffs Inhalation Given 12/26/18 2324)  aerochamber Z-Stat Plus/medium 1 each (1 each Other Given 12/26/18 2325)     Initial Impression / Assessment and Plan / ED Course  I have reviewed the triage vital signs and the nursing notes.  Pertinent labs & imaging results that were available during my care of the patient were reviewed by me and considered in my medical decision making (see chart for details).    On initial evaluation patient sleeping comfortably no acute distress has no complaints at this time reports intermittent shortness of breath but is unclear on the time or situation that this occurs.  Reports that he lost previous albuterol inhaler.  Reports that he is going to see his pulmonologist at sometime in the future but does not have an appointment at this time.  Heart rate approximately 70 bpm, SPO2 97% on room  air.  He is requesting food and drink. - Chest x-ray:  IMPRESSION:  No active cardiopulmonary disease.   EKG: Sinus rhythm no STEMI Confirmed by Marianna Fuss ([id number]) on 12/26/2018 10:42:34 PM - Discussed case with Dr. Stevie Kern, agrees the patient may be discharged with outpatient follow-up. - Patient given refill of albuterol inhaler as well as spacer today.  RN attempted to educate patient on its use but he refused stating that he is not short of breath and would like to continue sleeping.  Patient is to be discharged at this time is given resource guide for local shelters and referral for PCP.  As patient is without shortness of breath do not suspect COPD exacerbation, CHF, PE,  ACS, pneumonia, COVID-19 or other acute cardiopulmonary pathologies at this time.  At this time there does not appear to be any evidence of an acute emergency medical condition and the patient appears stable for discharge with appropriate outpatient follow up. Diagnosis was discussed with patient who verbalizes understanding of care plan and is agreeable to discharge. I have discussed return precautions with patient who verbalizes understanding of return precautions. Patient encouraged to follow-up with their PCP. All questions answered.   Note: Portions of this report may have been transcribed using voice recognition software. Every effort was made to ensure accuracy; however, inadvertent computerized transcription errors may still be present. Final Clinical Impressions(s) / ED Diagnoses   Final diagnoses:  Shortness of breath    ED Discharge Orders    None       Elizabeth Palau 12/26/18 2349    Bill Salinas, PA-C 12/26/18 2352    Milagros Loll, MD 12/27/18 1525

## 2018-12-26 NOTE — ED Notes (Signed)
PT UP WALKING TRIAGE. NO ACUTE DISTRESS. SMILING TALKING TO PEOPLE

## 2018-12-26 NOTE — ED Notes (Signed)
Pt states he does not need the inhaler because he is not short of breath, this nurse explained that he came in for shortness of breath, pt laughed and covered himself back up, after asking again patient agreed.

## 2018-12-26 NOTE — ED Notes (Signed)
Pt ripping off cords in aggressive manner, refusing to sign for discharge.

## 2018-12-26 NOTE — ED Notes (Signed)
During discharge, pt refusing to allow this nurse to unhook him to the monitor, security and off duty officer at bedside

## 2019-01-28 IMAGING — MR MR HEAD W/O CM
10 of 11 series · 36 of 48 positions shown · non-contrast
Comparison: Head CT 08/21/2017

CLINICAL DATA: Vertigo.

EXAM:
MRI HEAD WITHOUT CONTRAST
TECHNIQUE: Multiplanar, multiecho pulse sequences of the brain and surrounding
structures were obtained without intravenous contrast.

[Series 3: DWI · axial · 3.0mm · 0.94mm/px · z∈[-45,+114]mm · 8 of 110 slices shown (1 of 2)]
[im 1/110]
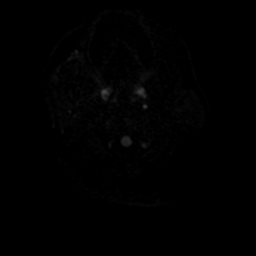
[im 16/110]
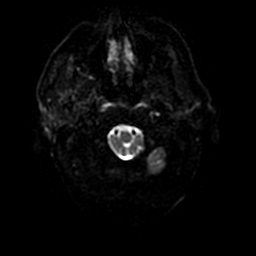
[im 32/110]
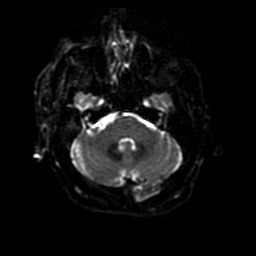
[im 47/110]
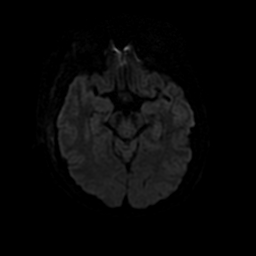
[im 63/110]
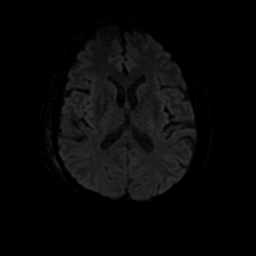
[im 78/110]
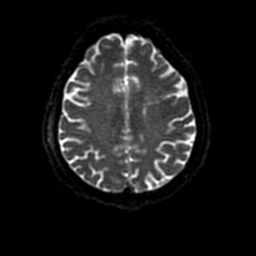
[im 94/110]
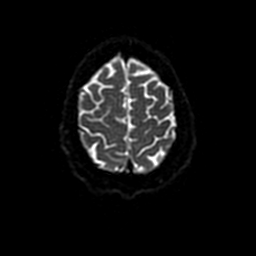
[im 110/110]
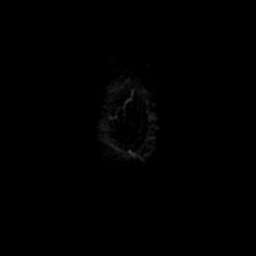

[Series 4: DWI · coronal · 4.0mm · 0.94mm/px · 5 of 74 slices shown (2 of 2)]
[im 1/74]
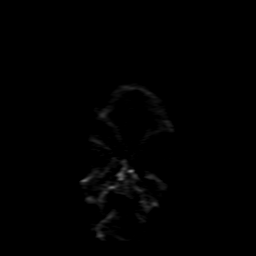
[im 19/74]
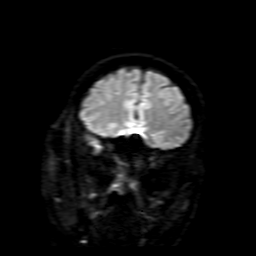
[im 37/74]
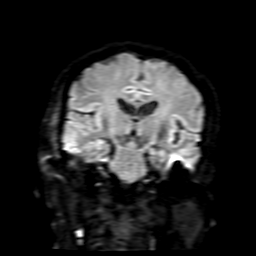
[im 55/74]
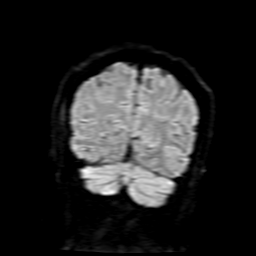
[im 74/74]
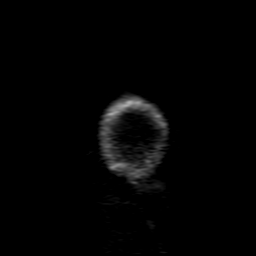

[Series 5: FLAIR · sagittal · 5.0mm · 0.47mm/px · 2 of 26 slices shown (1 of 2)]
[im 1/26]
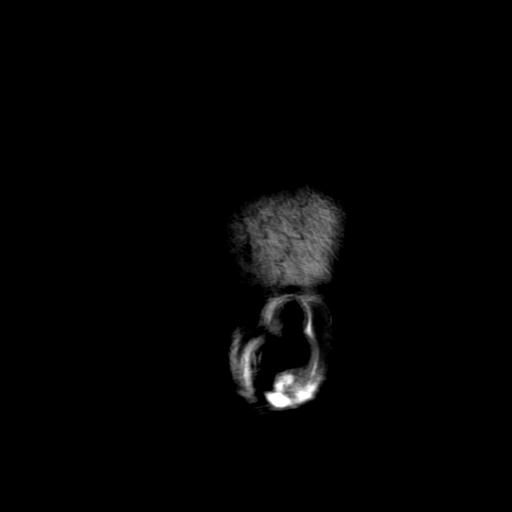
[im 26/26]
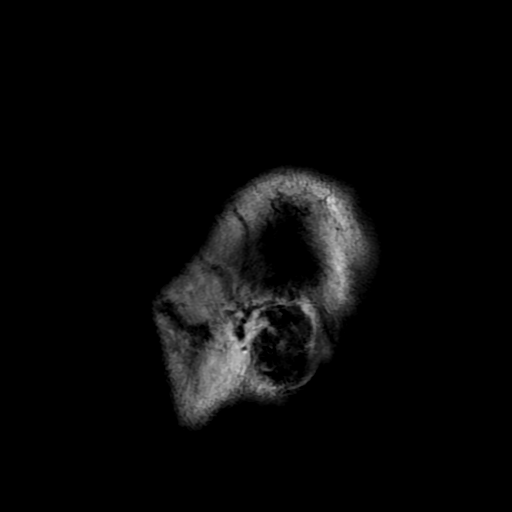

[Series 7: T2 · axial · 5.0mm · 0.47mm/px · z∈[-67,+93]mm · 2 of 29 slices shown (1 of 2)]
[im 1/29]
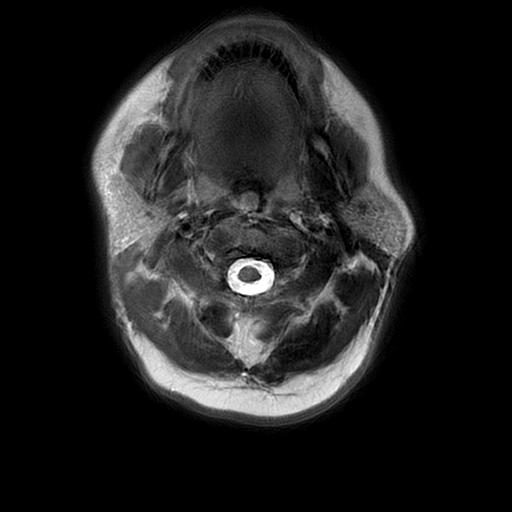
[im 29/29]
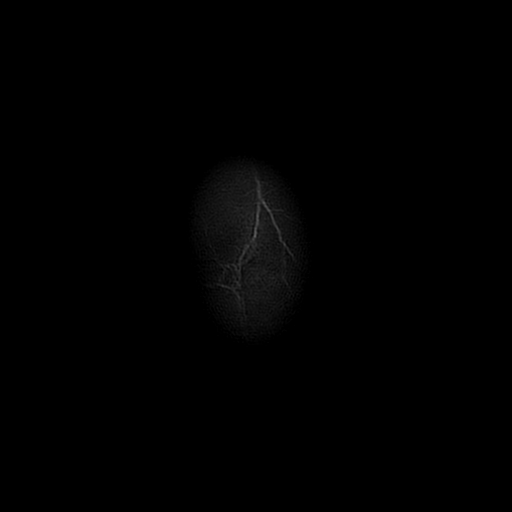

[Series 8: FLAIR · axial · 3.0mm · 0.45mm/px · z∈[-64,+90]mm · 2 of 28 slices shown (2 of 2)]
[im 1/28]
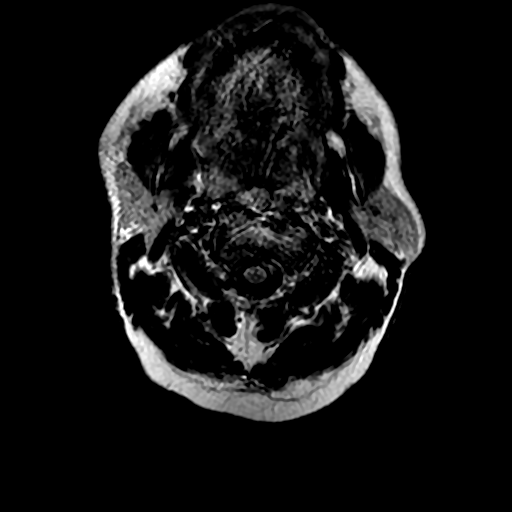
[im 28/28]
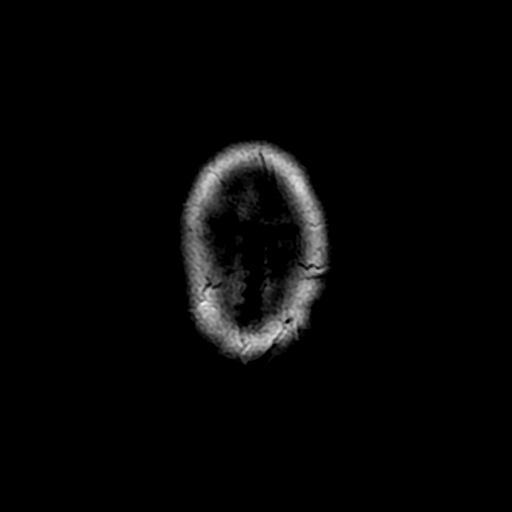

[Series 10: ax 3(person_name) · axial · 3.0mm · 0.94mm/px · z∈[-54,+107]mm · 4 of 56 slices shown]
[im 1/56]
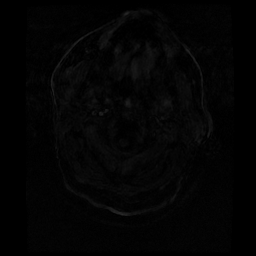
[im 19/56]
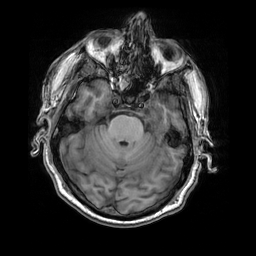
[im 37/56]
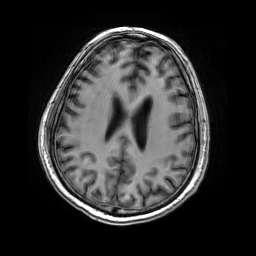
[im 56/56]
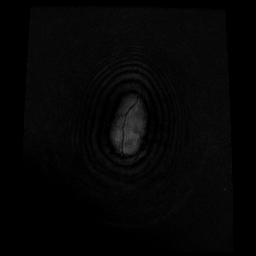

[Series 11: (person_name) · axial · 3.0mm · 0.47mm/px · z∈[-60,+5]mm · 4 of 108 slices shown]
[im 1/108]
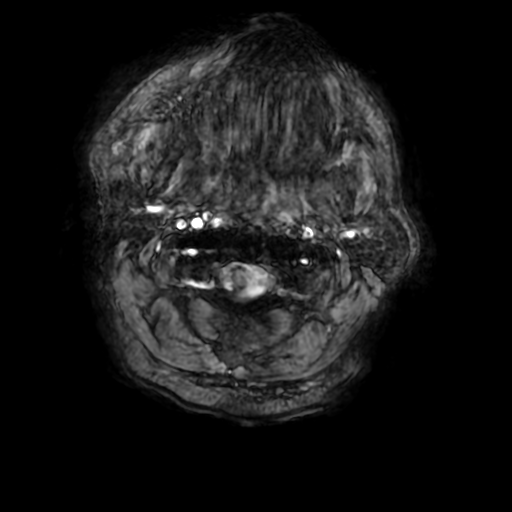
[im 16/108]
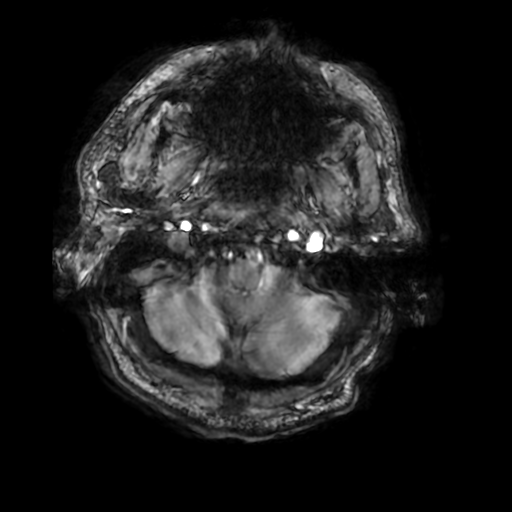
[im 31/108]
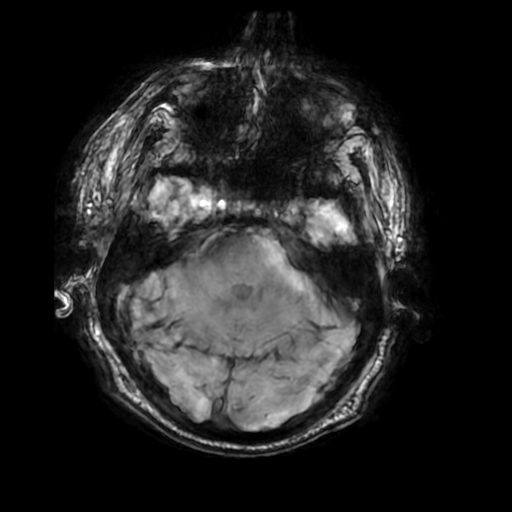
[im 46/108]
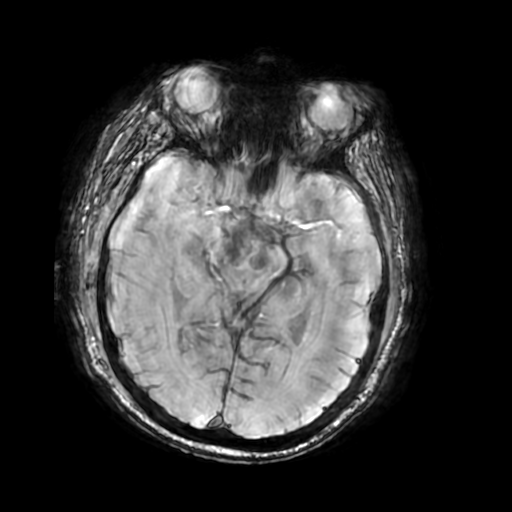

[Series 12: T2 · coronal · 5.0mm · 0.39mm/px · 2 of 30 slices shown (2 of 2)]
[im 1/30]
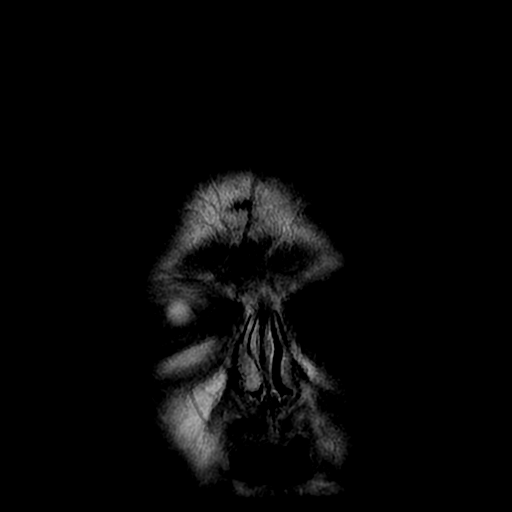
[im 30/30]
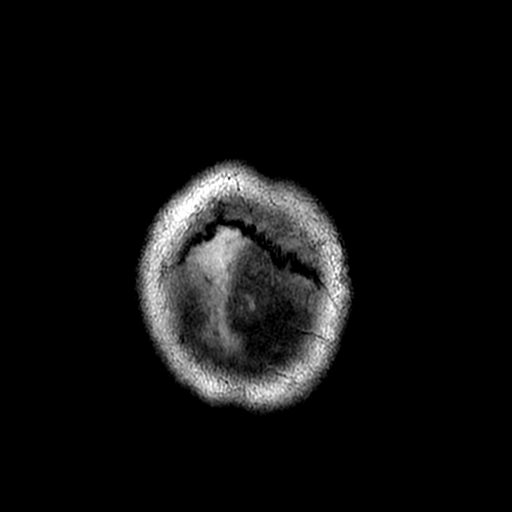

[Series 350: ADC · axial · 3.0mm · 0.94mm/px · z∈[-45,+114]mm · 4 of 53 slices shown (1 of 2)]
[im 1/53]
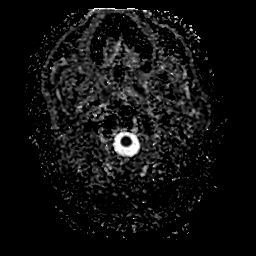
[im 18/53]
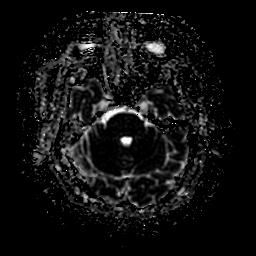
[im 35/53]
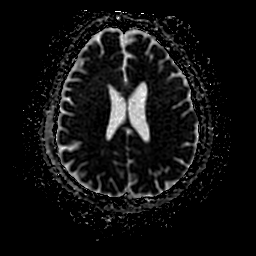
[im 53/53]
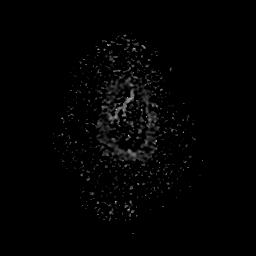

[Series 450: ADC · coronal · 4.0mm · 0.94mm/px · 3 of 37 slices shown (2 of 2)]
[im 1/37]
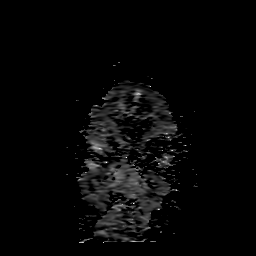
[im 19/37]
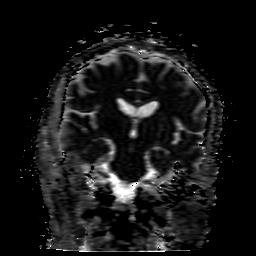
[im 37/37]
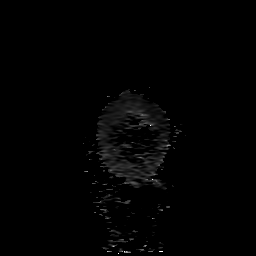

[36 of 48 positions shown; findings below may reference images not displayed]

FINDINGS: Brain: There is no evidence of acute infarct, intracranial
hemorrhage, mass, midline shift, or extra-axial fluid collection.
The ventricles and sulci are normal. A single small focus of T2
hyperintensity in the right corona radiata is nonspecific and not
considered abnormal for age.

Vascular: Major intracranial vascular flow voids are preserved.

Skull and upper cervical spine: Unremarkable bone marrow signal.

Sinuses/Orbits: Unremarkable orbits. Mild mucosal thickening in the
bilateral ethmoid air cells. Mucosal thickening and complex fluid in
the right sphenoid sinus. Small right mastoid effusion.

Other: None.
IMPRESSION: Unremarkable appearance of the brain for age.

## 2021-06-02 DEATH — deceased
